# Patient Record
Sex: Male | Born: 1975 | Race: White | Hispanic: No | Marital: Married | Smoking: Never smoker
Health system: Southern US, Community
[De-identification: ages and names within clinical notes are randomized; demographics above are authoritative.]

## PROBLEM LIST (undated history)

## (undated) DIAGNOSIS — T7840XA Allergy, unspecified, initial encounter: Secondary | ICD-10-CM

## (undated) DIAGNOSIS — R1031 Right lower quadrant pain: Secondary | ICD-10-CM

## (undated) DIAGNOSIS — J45909 Unspecified asthma, uncomplicated: Secondary | ICD-10-CM

## (undated) HISTORY — PX: HERNIA REPAIR: SHX51

## (undated) HISTORY — DX: Allergy, unspecified, initial encounter: T78.40XA

## (undated) HISTORY — DX: Right lower quadrant pain: R10.31

---

## 2016-08-15 ENCOUNTER — Other Ambulatory Visit: Payer: Self-pay | Admitting: Family Medicine

## 2016-08-15 DIAGNOSIS — R1909 Other intra-abdominal and pelvic swelling, mass and lump: Secondary | ICD-10-CM

## 2017-09-24 ENCOUNTER — Emergency Department (HOSPITAL_COMMUNITY): Payer: BLUE CROSS/BLUE SHIELD

## 2017-09-24 ENCOUNTER — Observation Stay (HOSPITAL_BASED_OUTPATIENT_CLINIC_OR_DEPARTMENT_OTHER): Payer: BLUE CROSS/BLUE SHIELD

## 2017-09-24 ENCOUNTER — Observation Stay (HOSPITAL_COMMUNITY)
Admission: EM | Admit: 2017-09-24 | Discharge: 2017-09-24 | Disposition: A | Discharge status: Home / Self Care | Payer: BLUE CROSS/BLUE SHIELD | Attending: Internal Medicine | Admitting: Internal Medicine

## 2017-09-24 ENCOUNTER — Encounter (HOSPITAL_COMMUNITY): Payer: Self-pay | Admitting: Emergency Medicine

## 2017-09-24 ENCOUNTER — Other Ambulatory Visit: Payer: Self-pay

## 2017-09-24 DIAGNOSIS — R0789 Other chest pain: Secondary | ICD-10-CM | POA: Diagnosis not present

## 2017-09-24 DIAGNOSIS — J45990 Exercise induced bronchospasm: Secondary | ICD-10-CM | POA: Diagnosis present

## 2017-09-24 DIAGNOSIS — R079 Chest pain, unspecified: Secondary | ICD-10-CM | POA: Diagnosis present

## 2017-09-24 DIAGNOSIS — Z79899 Other long term (current) drug therapy: Secondary | ICD-10-CM | POA: Insufficient documentation

## 2017-09-24 DIAGNOSIS — R001 Bradycardia, unspecified: Secondary | ICD-10-CM

## 2017-09-24 HISTORY — DX: Unspecified asthma, uncomplicated: J45.909

## 2017-09-24 LAB — CBC WITH DIFFERENTIAL/PLATELET
ABS IMMATURE GRANULOCYTES: 0 10*3/uL (ref 0.0–0.1)
BASOS PCT: 0 %
Basophils Absolute: 0 10*3/uL (ref 0.0–0.1)
EOS ABS: 0.1 10*3/uL (ref 0.0–0.7)
Eosinophils Relative: 1 %
HCT: 40.5 % (ref 39.0–52.0)
Hemoglobin: 13.3 g/dL (ref 13.0–17.0)
Immature Granulocytes: 0 %
Lymphocytes Relative: 29 %
Lymphs Abs: 1.6 10*3/uL (ref 0.7–4.0)
MCH: 29.7 pg (ref 26.0–34.0)
MCHC: 32.8 g/dL (ref 30.0–36.0)
MCV: 90.4 fL (ref 78.0–100.0)
Monocytes Absolute: 0.3 10*3/uL (ref 0.1–1.0)
Monocytes Relative: 6 %
NEUTROS ABS: 3.6 10*3/uL (ref 1.7–7.7)
Neutrophils Relative %: 64 %
PLATELETS: 162 10*3/uL (ref 150–400)
RBC: 4.48 MIL/uL (ref 4.22–5.81)
RDW: 12.1 % (ref 11.5–15.5)
WBC: 5.6 10*3/uL (ref 4.0–10.5)

## 2017-09-24 LAB — TROPONIN I

## 2017-09-24 LAB — I-STAT TROPONIN, ED: Troponin i, poc: 0 ng/mL (ref 0.00–0.08)

## 2017-09-24 LAB — COMPREHENSIVE METABOLIC PANEL
ALBUMIN: 4 g/dL (ref 3.5–5.0)
ALK PHOS: 51 U/L (ref 38–126)
ALT: 29 U/L (ref 17–63)
AST: 26 U/L (ref 15–41)
Anion gap: 8 (ref 5–15)
BUN: 12 mg/dL (ref 6–20)
CALCIUM: 8.7 mg/dL — AB (ref 8.9–10.3)
CO2: 25 mmol/L (ref 22–32)
CREATININE: 1.09 mg/dL (ref 0.61–1.24)
Chloride: 108 mmol/L (ref 101–111)
GFR calc Af Amer: >60 mL/min (ref >60)
GFR calc non Af Amer: >60 mL/min (ref >60)
Glucose, Bld: 131 mg/dL — ABNORMAL HIGH (ref 65–99)
Potassium: 3.8 mmol/L (ref 3.5–5.1)
SODIUM: 141 mmol/L (ref 135–145)
Total Bilirubin: 0.8 mg/dL (ref 0.3–1.2)
Total Protein: 6.6 g/dL (ref 6.5–8.1)

## 2017-09-24 LAB — HEPATIC FUNCTION PANEL
ALBUMIN: 4 g/dL (ref 3.5–5.0)
ALT: 28 U/L (ref 17–63)
AST: 22 U/L (ref 15–41)
Alkaline Phosphatase: 54 U/L (ref 38–126)
BILIRUBIN DIRECT: 0.1 mg/dL (ref 0.1–0.5)
BILIRUBIN TOTAL: 0.7 mg/dL (ref 0.3–1.2)
Indirect Bilirubin: 0.6 mg/dL (ref 0.3–0.9)
Total Protein: 6.8 g/dL (ref 6.5–8.1)

## 2017-09-24 LAB — LIPASE, BLOOD: Lipase: 30 U/L (ref 11–51)

## 2017-09-24 LAB — D-DIMER, QUANTITATIVE (NOT AT ARMC): D DIMER QUANT: 0.35 ug{FEU}/mL (ref 0.00–0.50)

## 2017-09-24 LAB — ECHOCARDIOGRAM COMPLETE
HEIGHTINCHES: 73 in
Weight: 3520 oz

## 2017-09-24 LAB — TSH: TSH: 2.682 u[IU]/mL (ref 0.350–4.500)

## 2017-09-24 LAB — MAGNESIUM: Magnesium: 2 mg/dL (ref 1.7–2.4)

## 2017-09-24 LAB — HIV ANTIBODY (ROUTINE TESTING W REFLEX): HIV Screen 4th Generation wRfx: NONREACTIVE

## 2017-09-24 MED ORDER — ALBUTEROL SULFATE (2.5 MG/3ML) 0.083% IN NEBU: 2.5000 mg | INHALATION_SOLUTION | Freq: Four times a day (QID) | RESPIRATORY_TRACT | Status: DC | PRN

## 2017-09-24 MED ORDER — ASPIRIN EC 81 MG PO TBEC
81.0000 mg | DELAYED_RELEASE_TABLET | Freq: Every day | ORAL | Status: DC
  Administered 2017-09-24: 81 mg via ORAL
  Filled 2017-09-24: qty 1

## 2017-09-24 MED ORDER — SODIUM CHLORIDE 0.9 % IV BOLUS
1000.0000 mL | Freq: Once | INTRAVENOUS | Status: AC
Start: 2017-09-24 — End: 2017-09-24
  Administered 2017-09-24: 1000 mL via INTRAVENOUS

## 2017-09-24 MED ORDER — ONDANSETRON HCL 4 MG PO TABS: 4.0000 mg | ORAL_TABLET | Freq: Four times a day (QID) | ORAL | Status: DC | PRN

## 2017-09-24 MED ORDER — OMEPRAZOLE MAGNESIUM 20 MG PO TBEC: 20.0000 mg | DELAYED_RELEASE_TABLET | Freq: Every day | ORAL | Status: DC

## 2017-09-24 MED ORDER — ACETAMINOPHEN 325 MG PO TABS: 650.0000 mg | ORAL_TABLET | Freq: Four times a day (QID) | ORAL | Status: DC | PRN

## 2017-09-24 MED ORDER — KETOROLAC TROMETHAMINE 10 MG PO TABS
10.0000 mg | ORAL_TABLET | Freq: Once | ORAL | Status: AC
  Administered 2017-09-24: 10 mg via ORAL
  Filled 2017-09-24: qty 1

## 2017-09-24 MED ORDER — ACETAMINOPHEN 650 MG RE SUPP: 650.0000 mg | Freq: Four times a day (QID) | RECTAL | Status: DC | PRN

## 2017-09-24 MED ORDER — ONDANSETRON HCL 4 MG/2ML IJ SOLN: 4.0000 mg | Freq: Four times a day (QID) | INTRAMUSCULAR | Status: DC | PRN

## 2017-09-24 MED ORDER — MORPHINE SULFATE (PF) 4 MG/ML IV SOLN
4.0000 mg | Freq: Once | INTRAVENOUS | Status: AC
  Administered 2017-09-24: 4 mg via INTRAVENOUS
  Filled 2017-09-24: qty 1

## 2017-09-24 MED ORDER — IBUPROFEN 200 MG PO TABS: 800.0000 mg | ORAL_TABLET | Freq: Three times a day (TID) | ORAL | Status: DC

## 2017-09-24 NOTE — ED Notes (Signed)
PAGED ADMITTING TO RN  

## 2017-09-24 NOTE — ED Notes (Signed)
Pt has a question concerning his pulse rate dropping low last night. MD paged.

## 2017-09-24 NOTE — ED Notes (Signed)
Pt complaining of increased pain. MD paged. Pt updated. Pt rates pain 4/10.

## 2017-09-24 NOTE — ED Notes (Signed)
Pt speaking to hospitalist on the phone in regards to discharge.

## 2017-09-24 NOTE — ED Notes (Signed)
BIB EMS from home, pt woke up to central CP with radiation to L arm/L shoulder. Pressure like 8/10, currently 3/10. Pt also reports dizziness and lightheadedness that caused him to have to sit down. En route pt was hypotensive and bradycardic (80's systolic and HR 30's)

## 2017-09-24 NOTE — H&P (Signed)
History and Physical    Benjamin PerchesJason Mckinney ZOX:[id number] DOB: 1975/05/21 DOA: 09/24/2017  PCP: Shirlean MylarWebb, Carol, MD  Patient coming from: Home.  Chief Complaint: Chest pain.  HPI: Benjamin PerchesJason Mckinney is a 42 y.o. male with history of exercise-induced asthma presents to the ER after patient had been having chest pain which woke him up from sleep at around 1 AM this morning.  Patient chest pain is retrosternal pressure-like increase on deep inspiration radiating to his left shoulder.  Patient took some Aleve since his shoulder was hurting.  Despite which patient was still having pain and called EMS.  Patient also has been having some mild dizziness.  Denies any abdominal pain nausea vomiting shortness of breath productive cough fever or chills.  ED Course: In the ER patient was found to be having episodes of bradycardia.  Chest pain improved with morphine but has not resolved.  Chest x-ray unremarkable EKG shows sinus bradycardia with heart rate around 53 bpm early repolarization changes.  Patient admitted for further management.  Review of Systems: As per HPI, rest all negative.   Past Medical History:  Diagnosis Date  . Asthma     Past Surgical History:  Procedure Laterality Date  . HERNIA REPAIR       reports that he has never smoked. He has never used smokeless tobacco. He reports that he drinks alcohol. He reports that he does not use drugs.  No Known Allergies  Family History  Problem Relation Age of Onset  . Hypertension Mother     Prior to Admission medications   Medication Sig Start Date End Date Taking? Authorizing Provider  albuterol (PROVENTIL HFA;VENTOLIN HFA) 108 (90 Base) MCG/ACT inhaler Inhale 1-2 puffs into the lungs every 6 (six) hours as needed for wheezing or shortness of breath.   Yes [provider]    Physical Exam: Vitals:   09/24/17 0400 09/24/17 0430 09/24/17 0500 09/24/17 0530  BP: 119/84 121/84 119/86 125/84  Pulse: (!) 56 69 (!) 54 66  Resp: 15 20  19 17   Temp:      TempSrc:      SpO2: 100% 100% 97% 98%  Weight:      Height:          Constitutional: Moderately built and nourished. Vitals:   09/24/17 0400 09/24/17 0430 09/24/17 0500 09/24/17 0530  BP: 119/84 121/84 119/86 125/84  Pulse: (!) 56 69 (!) 54 66  Resp: 15 20 19 17   Temp:      TempSrc:      SpO2: 100% 100% 97% 98%  Weight:      Height:       Eyes: Anicteric no pallor. ENMT: No discharge from the ears eyes nose or mouth. Neck: No mass felt.  No neck rigidity.  No JVD appreciated. Respiratory: No rhonchi or crepitations. Cardiovascular: S1-S2 heard no murmurs appreciated. Abdomen: Soft nontender bowel sounds present.  No guarding or rigidity. Musculoskeletal: No edema no joint effusion. Skin: No rash. Neurologic: Alert awake oriented to time place and person.  Moves all extremities. Psychiatric: Appears normal.  Normal affect.   Labs on Admission: I have personally reviewed following labs and imaging studies  CBC: Recent Labs  Lab 09/24/17 0322  WBC 5.6  NEUTROABS 3.6  HGB 13.3  HCT 40.5  MCV 90.4  PLT 162   Basic Metabolic Panel: Recent Labs  Lab 09/24/17 0322  NA 141  K 3.8  CL 108  CO2 25  GLUCOSE 131*  BUN 12  CREATININE  1.09  CALCIUM 8.7*   GFR: Estimated Creatinine Clearance: 110.9 mL/min (by C-G formula based on SCr of 1.09 mg/dL). Liver Function Tests: Recent Labs  Lab 09/24/17 0322  AST 26  ALT 29  ALKPHOS 51  BILITOT 0.8  PROT 6.6  ALBUMIN 4.0   No results for input(s): LIPASE, AMYLASE in the last 168 hours. No results for input(s): AMMONIA in the last 168 hours. Coagulation Profile: No results for input(s): INR, PROTIME in the last 168 hours. Cardiac Enzymes: No results for input(s): CKTOTAL, CKMB, CKMBINDEX, TROPONINI in the last 168 hours. BNP (last 3 results) No results for input(s): PROBNP in the last 8760 hours. HbA1C: No results for input(s): HGBA1C in the last 72 hours. CBG: No results for input(s):  GLUCAP in the last 168 hours. Lipid Profile: No results for input(s): CHOL, HDL, LDLCALC, TRIG, CHOLHDL, LDLDIRECT in the last 72 hours. Thyroid Function Tests: No results for input(s): TSH, T4TOTAL, FREET4, T3FREE, THYROIDAB in the last 72 hours. Anemia Panel: No results for input(s): VITAMINB12, FOLATE, FERRITIN, TIBC, IRON, RETICCTPCT in the last 72 hours. Urine analysis: No results found for: COLORURINE, APPEARANCEUR, LABSPEC, PHURINE, GLUCOSEU, HGBUR, BILIRUBINUR, KETONESUR, PROTEINUR, UROBILINOGEN, NITRITE, LEUKOCYTESUR Sepsis Labs: @LABRCNTIP (procalcitonin:4,lacticidven:4) )No results found for this or any previous visit (from the past 240 hour(s)).   Radiological Exams on Admission: Dg Chest 2 View  Result Date: 09/24/2017 CLINICAL DATA:  Chest pain. EXAM: CHEST - 2 VIEW COMPARISON:  None. FINDINGS: The cardiomediastinal contours are normal. Mild bronchitic change. Pulmonary vasculature is normal. No consolidation, pleural effusion, or pneumothorax. No acute osseous abnormalities are seen. IMPRESSION: Mild bronchial thickening of uncertain chronicity. Electronically Signed   By: Rubye Oaks M.D.   On: 09/24/2017 03:53    EKG: Independently reviewed.  Sinus bradycardia with rate around 53 bpm early repolarization changes.  Assessment/Plan Principal Problem:   Chest pain Active Problems:   Exercise-induced asthma    1. Chest pain -as pressure-like and also pleuritic component.  Patient states his chest pain worsens on lying down which is concerning for pericarditis but EKG does not show any definite signs of it.  No obvious pericardial rub.  We will cycle cardiac markers check d-dimer 2D echo.  Aspirin for now. 2. Periods of bradycardia -Per the ER physician patient's heart rate had decreased up to the 40s.  We will closely monitor.  Check TSH. 3. History of exercise-induced asthma takes as needed albuterol prior to exercise. 4. Patient states he drinks alcohol usually 2  beers every day.  Closely monitor for any withdrawal.   DVT prophylaxis: SCDs.  Avoiding Lovenox until he gets to make sure there is no pericarditis. Code Status: Full code. Family Communication: Wife. Disposition Plan: Home. Consults called: None. Admission status: Observation.   Eduard Clos MD [facility] Pager [phone].  If 7PM-7AM, please contact night-coverage [email or web address] Password [facility]  09/24/2017, 6:14 AM

## 2017-09-24 NOTE — Progress Notes (Signed)
  Echocardiogram 2D Echocardiogram has been performed.  Pieter PartridgeBrooke S Franshesca Chipman 09/24/2017, 9:09 AM

## 2017-09-24 NOTE — ED Provider Notes (Signed)
[facility] EMERGENCY DEPARTMENT Provider Note   CSN: [id number] Arrival date & time: 09/24/17  2130     History   Chief Complaint Chief Complaint  Patient presents with  . Chest Pain    HPI Benjamin Mckinney is a 42 y.o. male.  Patient is a 42 year old male with no significant past medical history.  He presents today with complaints of chest discomfort.  This woke him from sleep at approximately 130 this morning after going to bed feeling well and in his normal state of health.  He describes it as a pressure to the left side of his chest radiating to his shoulder blade.  This is not associated with any shortness of breath, nausea, diaphoresis, or radiation to the arm or jaw.  He does report some dizziness.  911 was called and the patient was transported here.  While in transport, his heart rate decreased into the 30s on several occasions.  Transport was otherwise uneventful.  Patient has no prior cardiac history and no cardiac risk factors.  The history is provided by the patient.  Chest Pain   This is a new problem. Episode onset: 1:30 AM. The problem occurs constantly. The problem has been gradually worsening. The pain is present in the substernal region. The pain is moderate. The quality of the pain is described as pressure-like. The pain radiates to the left shoulder. Associated symptoms include dizziness. Pertinent negatives include no cough, no diaphoresis, no fever, no nausea and no shortness of breath.    Past Medical History:  Diagnosis Date  . Asthma     There are no active problems to display for this patient.   History reviewed. No pertinent surgical history.      Home Medications    Prior to Admission medications   Not on File    Family History No family history on file.  Social History Social History   Tobacco Use  . Smoking status: Never Smoker  . Smokeless tobacco: Never Used  Substance Use Topics  . Alcohol use: Yes    Frequency:  Never    Comment: Socially 2-3 drinks per day  . Drug use: Never     Allergies   Patient has no known allergies.   Review of Systems Review of Systems  Constitutional: Negative for diaphoresis and fever.  Respiratory: Negative for cough and shortness of breath.   Cardiovascular: Positive for chest pain.  Gastrointestinal: Negative for nausea.  Neurological: Positive for dizziness.  All other systems reviewed and are negative.    Physical Exam Updated Vital Signs BP 120/89 (BP Location: Right Arm)   Pulse 65   Temp (!) 97.4 F (36.3 C) (Oral)   Resp 16   Ht 6\' 1"  (1.854 m)   Wt 99.8 kg (220 lb)   SpO2 100%   BMI 29.03 kg/m   Physical Exam  Constitutional: He is oriented to person, place, and time. He appears well-developed and well-nourished. No distress.  HENT:  Head: Normocephalic and atraumatic.  Mouth/Throat: Oropharynx is clear and moist.  Neck: Normal range of motion. Neck supple.  Cardiovascular: Regular rhythm. Bradycardia present. Exam reveals no friction rub.  No murmur heard. Pulmonary/Chest: Effort normal and breath sounds normal. No respiratory distress. He has no wheezes. He has no rales.  Abdominal: Soft. Bowel sounds are normal. He exhibits no distension. There is no tenderness.  Musculoskeletal: Normal range of motion. He exhibits no edema.       Right lower leg: Normal. He exhibits  no tenderness and no edema.       Left lower leg: Normal. He exhibits no tenderness and no edema.  Neurological: He is alert and oriented to person, place, and time. Coordination normal.  Skin: Skin is warm and dry. He is not diaphoretic.  Nursing note and vitals reviewed.    ED Treatments / Results  Labs (all labs ordered are listed, but only abnormal results are displayed) Labs Reviewed  COMPREHENSIVE METABOLIC PANEL  CBC WITH DIFFERENTIAL/PLATELET  I-STAT TROPONIN, ED    EKG EKG Interpretation  Date/Time:  Thursday September 24 2017 03:16:01 EDT Ventricular  Rate:  53 PR Interval:    QRS Duration: 109 QT Interval:  443 QTC Calculation: 416 R Axis:   72 Text Interpretation:  Sinus Bradycardia Early Repolarization Confirmed by Geoffery LyonseLo, Mabelle Mungin ([id number]) on 09/24/2017 3:21:13 AM   Radiology No results found.  Procedures Procedures (including critical care time)  Medications Ordered in ED Medications  sodium chloride 0.9 % bolus 1,000 mL (has no administration in time range)     Initial Impression / Assessment and Plan / ED Course  I have reviewed the triage vital signs and the nursing notes.  Pertinent labs & imaging results that were available during my care of the patient were reviewed by me and considered in my medical decision making (see chart for details).  Patient presents here with complaints of chest discomfort that woke him from sleep at approximately 1:30 AM.  His work-up is thus far unremarkable including a negative troponin and normal EKG.  He has had episodes of bradycardia while in the ambulance and in the emergency department.  He has been seen with his rate dropping into the 30s on several occasions.  I feel as though the patient should be admitted for further work-up.  I have spoken with Dr. Toniann FailKakrakandy who agrees to admit.  Final Clinical Impressions(s) / ED Diagnoses   Final diagnoses:  None    ED Discharge Orders    None       Geoffery Lyonselo, Debroh Sieloff, MD 09/24/17 [phone]

## 2020-02-09 IMAGING — DX DG CHEST 2V
2 series · 2 of 2 positions shown · non-contrast
Comparison: None.

CLINICAL DATA: Chest pain.

EXAM:
CHEST - 2 VIEW

[w chest pa]
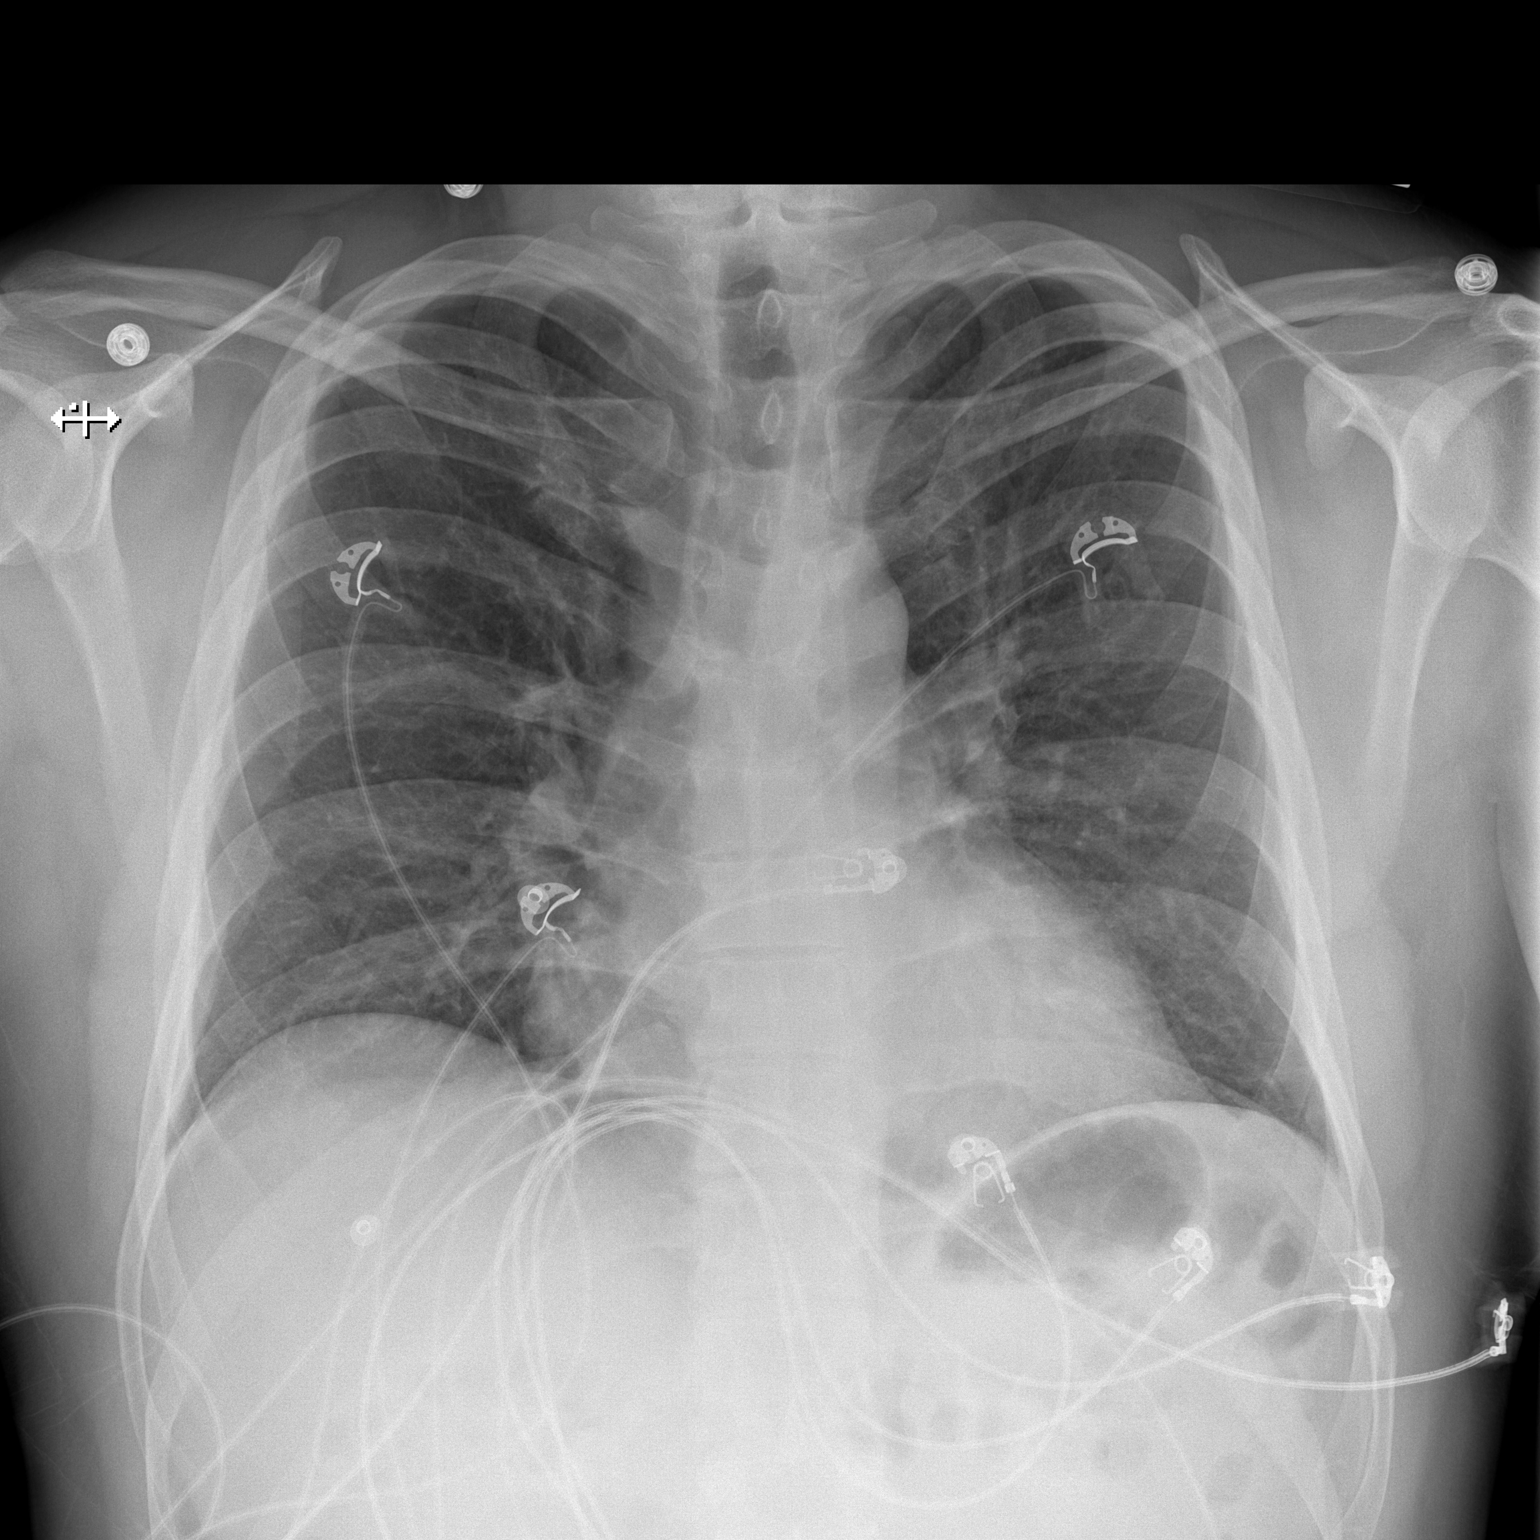

[w chest lat]
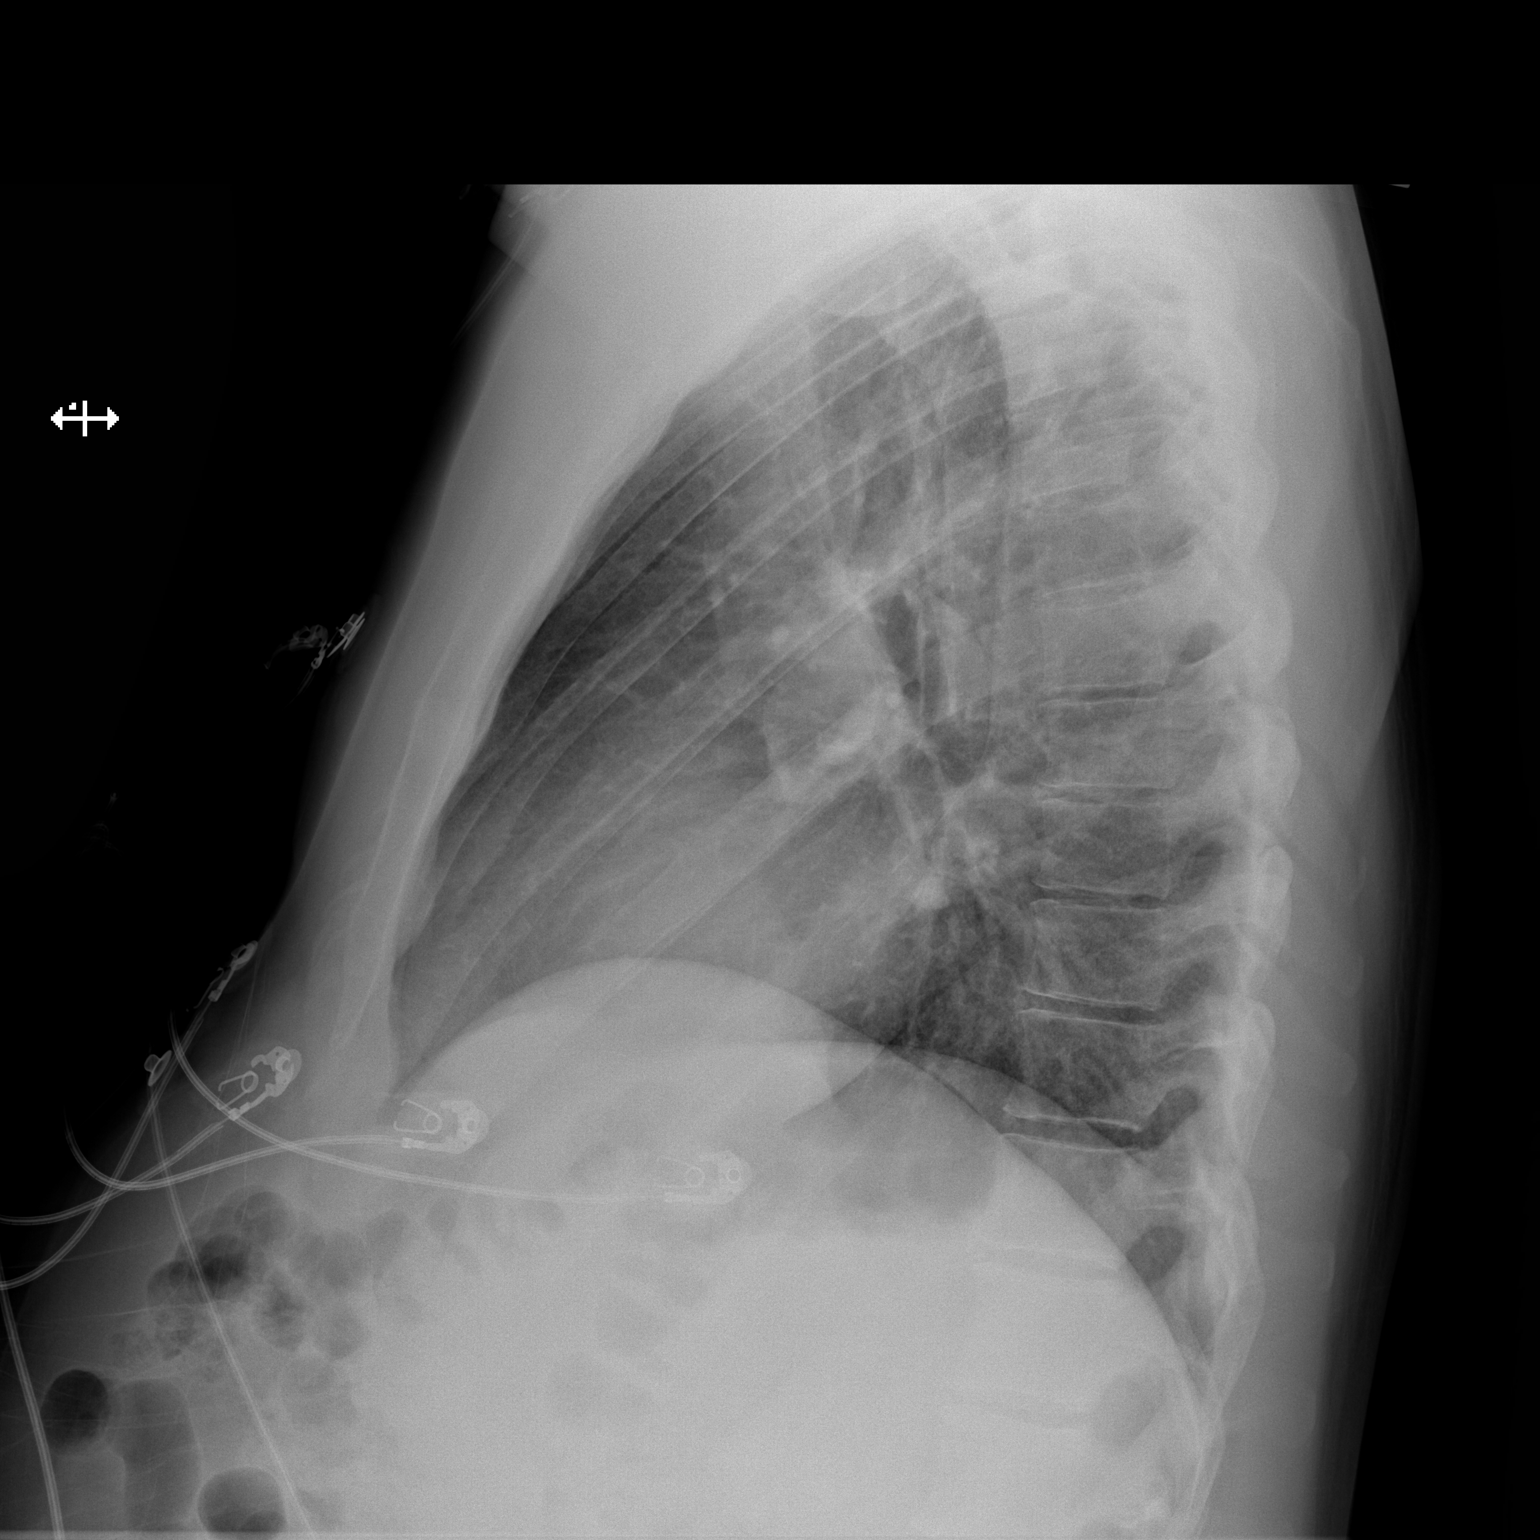

[2 of 2 positions shown; findings below may reference images not displayed]

FINDINGS: The cardiomediastinal contours are normal. Mild bronchitic change.
Pulmonary vasculature is normal. No consolidation, pleural effusion,
or pneumothorax. No acute osseous abnormalities are seen.
IMPRESSION: Mild bronchial thickening of uncertain chronicity.

## 2021-08-08 ENCOUNTER — Encounter (HOSPITAL_BASED_OUTPATIENT_CLINIC_OR_DEPARTMENT_OTHER): Payer: Self-pay | Admitting: Nurse Practitioner

## 2021-08-08 ENCOUNTER — Ambulatory Visit (HOSPITAL_BASED_OUTPATIENT_CLINIC_OR_DEPARTMENT_OTHER): Payer: No Typology Code available for payment source | Admitting: Nurse Practitioner

## 2021-08-08 VITALS — BP 117/82 | HR 80 | Temp 98.6°F | Ht 73.0 in | Wt 217.4 lb

## 2021-08-08 DIAGNOSIS — J45990 Exercise induced bronchospasm: Secondary | ICD-10-CM | POA: Diagnosis not present

## 2021-08-08 DIAGNOSIS — R55 Syncope and collapse: Secondary | ICD-10-CM

## 2021-08-08 DIAGNOSIS — M940 Chondrocostal junction syndrome [Tietze]: Secondary | ICD-10-CM

## 2021-08-08 DIAGNOSIS — R0782 Intercostal pain: Secondary | ICD-10-CM

## 2021-08-08 DIAGNOSIS — Z0000 Encounter for general adult medical examination without abnormal findings: Secondary | ICD-10-CM

## 2021-08-08 DIAGNOSIS — R944 Abnormal results of kidney function studies: Secondary | ICD-10-CM

## 2021-08-08 MED ORDER — MELOXICAM 15 MG PO TABS: 15.0000 mg | ORAL_TABLET | Freq: Every day | ORAL | 0 refills | Status: DC

## 2021-08-08 MED ORDER — ALBUTEROL SULFATE HFA 108 (90 BASE) MCG/ACT IN AERS: 1.0000 | INHALATION_SPRAY | Freq: Four times a day (QID) | RESPIRATORY_TRACT | 6 refills | Status: DC | PRN

## 2021-08-08 NOTE — Patient Instructions (Signed)
Thank you for choosing [facility] [facility] for your Primary Care needs. I am excited for the opportunity to partner with you to meet your health care goals. It was a pleasure meeting you today! ? ?Recommendations from today's visit: ?I have sent the referral for cardiology for the office on the second floor of our building. Someone from that office will call you to schedule this in the next few days ?If you have any of the symptoms again, please let us know immediately.  ?I would avoid energy drinks for now . ?We will get some labs today to make sure that there is nothing going on that we need to address that could have caused this.  ?You can look up vasovagal reactions at sites like [facility] or [facility] to see if this correlates with anything you have felt ? ?Information on diet, exercise, and health maintenance recommendations are listed below. This is information to help you be sure you are on track for optimal health and monitoring.  ? ?Please look over this and let us know if you have any questions or if you have completed any of the health maintenance outside of [place] so that we can be sure your records are up to date.  ?___________________________________________________________ ?About Me: ?I am an Adult-Geriatric Nurse Practitioner with a background in caring for patients for more than 20 years with a strong intensive care background. I provide primary care and sports medicine services to patients age 35 and older within this office. My education had a strong focus on caring for the older adult population, which I am passionate about. I am also the director of the APP Fellowship with [facility].  ? ?My desire is to provide you with the best service through preventive medicine and supportive care. I consider you a part of the medical team and value your input. I work diligently to ensure that you are heard and your needs are met in a safe and effective manner. I want  you to feel comfortable with me as your provider and want you to know that your health concerns are important to me. ? ?For your information, our office hours are: ?Monday, Tuesday, and Thursday 8:00 AM - 5:00 PM ?Wednesday and Friday 8:00 AM - 12:00 PM.  ? ?In my time away from the office I am teaching new APP's within the system and am unavailable, but my partner, Dr. Burnard Bunting is in the office for emergent needs.  ? ?If you have questions or concerns, please call our office at [phone] or send [place] a MyChart message and we will respond as quickly as possible.  ?____________________________________________________________ ?MyChart:  ?For all urgent or time sensitive needs we ask that you please call the office to avoid delays. Our number is [phone]. ?MyChart is not constantly monitored and due to the large volume of messages a day, replies may take up to 72 business hours. ? ?MyChart Policy: ?MyChart allows for you to see your visit notes, after visit summary, provider recommendations, lab and tests results, make an appointment, request refills, and contact your provider or the office for non-urgent questions or concerns. Providers are seeing patients during normal business hours and do not have built in time to review MyChart messages.  ?We ask that you allow a minimum of 3 business days for responses to [employer]. For this reason, please do not send urgent requests through [place]. Please call the office at [phone]. ?New and ongoing conditions may require  a visit. We have virtual and in person visit available for your convenience.  ?Complex MyChart concerns may require a visit. Your provider may request you schedule a virtual or in person visit to ensure we are providing the best care possible. ?MyChart messages sent after 11:00 AM on Friday will not be received by the provider until Monday morning.  ?  ?Lab and Test Results: ?You will receive your lab and test results on MyChart as soon as  they are completed and results have been sent by the lab or testing facility. Due to this service, you will receive your results BEFORE your provider.  ?I review lab and tests results each morning prior to seeing patients. Some results require collaboration with other providers to ensure you are receiving the most appropriate care. For this reason, we ask that you please allow a minimum of 3-5 business days from the time the ALL results have been received for your provider to receive and review lab and test results and contact you about these.  ?Most lab and test result comments from the provider will be sent through [place]. Your provider may recommend changes to the plan of care, follow-up visits, repeat testing, ask questions, or request an office visit to discuss these results. You may reply directly to this message or call the office at [phone] to provide information for the provider or set up an appointment. ?In some instances, you will be called with test results and recommendations. Please let us know if this is preferred and we will make note of this in your chart to provide this for you.    ?If you have not heard a response to your lab or test results in 5 business days from all results returning to [place], please call the office to let us know. We ask that you please avoid calling prior to this time unless there is an emergent concern. Due to high call volumes, this can delay the resulting process. ? ?After Hours: ?For all non-emergency after hours needs, please call the office at [phone] and select the option to reach the on-call provider service. On-call services are shared between multiple [place] offices and therefore it will not be possible to speak directly with your provider. On-call providers may provide medical advice and recommendations, but are unable to provide refills for maintenance medications.  ?For all emergency or urgent medical needs after normal business hours, we  recommend that you seek care at the closest Urgent Care or Emergency Department to ensure appropriate treatment in a timely manner.  ?[facility] at [place] has a 24 hour emergency room located on the ground floor for your convenience.  ? ?Urgent Concerns During the Business Day ?Providers are seeing patients from 8AM to [place] with a busy schedule and are most often not able to respond to non-urgent calls until the end of the day or the next business day. ?If you should have URGENT concerns during the day, please call and speak to the nurse or schedule a same day appointment so that we can address your concern without delay.  ? ?Thank you, again, for choosing me as your health care partner. I appreciate your trust and look forward to learning more about you.  ? ?Worthy Keeler, DNP, AGNP-c ?___________________________________________________________ ? ?Health Maintenance Recommendations ?Screening Testing ?Mammogram ?Every 1 -2 years based on history and risk factors ?Starting at age 56 ?Pap Smear ?Ages 21-39 every 3 years ?Ages 7-65 every 5 years with HPV testing ?More frequent testing may  be required based on results and history ?Colon Cancer Screening ?Every 1-10 years based on test performed, risk factors, and history ?Starting at age 22 ?Bone Density Screening ?Every 2-10 years based on history ?Starting at age 69 for women ?Recommendations for men differ based on medication usage, history, and risk factors ?AAA Screening ?One time ultrasound ?Men 79-45 years old who have every smoked ?Lung Cancer Screening ?Low Dose Lung CT every 12 months ?Age 38-80 years with a 30 pack-year smoking history who still smoke or who have quit within the last 15 years ? ?Screening Labs ?Routine  Labs: Complete Blood Count (CBC), Complete Metabolic Panel (CMP), Cholesterol (Lipid Panel) ?Every 6-12 months based on history and medications ?May be recommended more frequently based on current conditions or previous  results ?Hemoglobin A1c Lab ?Every 3-12 months based on history and previous results ?Starting at age 41 or earlier with diagnosis of diabetes, high cholesterol, BMI >26, and/or risk factors ?Frequent monitoring for

## 2021-08-08 NOTE — Progress Notes (Signed)
?Orma Render, DNP, AGNP-c ?Primary Care & Sports Medicine ?[place], [place] [address] ?[phone] ? ?New patient visit ? ? ?Patient: Benjamin Mckinney   DOB: 1976-02-18   46 y.o. Male  MRN: [id number] ?Visit Date: 08/08/2021 ? ?Patient Care Team: ?Riley Hallum, Coralee Pesa, NP as PCP - General (Nurse Practitioner) ? ?Today's Vitals  ? 08/08/21 0847  ?BP: 117/82  ?Pulse: 80  ?Temp: 98.6 ?F (37 ?C)  ?SpO2: 97%  ?Weight: 217 lb 6.4 oz (98.6 kg)  ?Height: $RemoveB'6\' 1"'CNXCnTCH$  (1.854 m)  ? ?Body mass index is 28.68 kg/m?.  ? ?Today's healthcare provider: Orma Render, NP  ? ?Chief Complaint  ?Patient presents with  ? Establish Care  ? ?Subjective  ?  ?Kenderick Kobler is a 47 y.o. male who presents today as a new patient to establish care.  ?  ?Patient endorses the following concerns presently: ?Syncope and collapse  ?- appx 1 month ago while on a plane waiting for takeoff experienced syncopal episode ?- unsure of how long he was unconscious, but possibly 5 minutes or less  ?- when he awoke he felt shaky, but otherwise OK ?- paramedics came and checked him out and vitals were all normal within 10 minutes of event occurence ?- had an energy drink prior to the event ?- vomiting while unconscious ?- prior to event noted dizziness and nausea, but no CP, palpitations, headache, vision changes, weakness ?- had breakfast and coffee prior to the event ?- has never happened before or since ?- he does admit to drinking alcohol the night before, but feels he was well hydrated during the event ?- In 2019 he experienced CP, in ED was noted to be hypotensive and bradycardic into the 30's ?- no follow-up since that time ?- no personal or FHx cardiac dz ?- occasional sternal CP after working out- feels like "connect tissue" ?- previous ECHO when younger, not sure what for ? ?Stays active ?Works out in the morning at [employer] with his 22 year old daughter ?Backpacking and hiking routinely ? ?History reviewed and reveals  the following: ?Past Medical History:  ?Diagnosis Date  ? Asthma   ? exercise induced  ? Groin pain, right   ? ?Past Surgical History:  ?Procedure Laterality Date  ? HERNIA REPAIR    ? 46 years old- umbilical  ? ?Family Status  ?Relation Name Status  ? Mother  (Not Specified)  ? Father  (Not Specified)  ? ?Family History  ?Problem Relation Age of Onset  ? Hypertension Mother   ? Liver disease Father   ? ?Social History  ? ?Socioeconomic History  ? Marital status: Married  ?  Spouse name: Not on file  ? Number of children: Not on file  ? Years of education: Not on file  ? Highest education level: Not on file  ?Occupational History  ? Not on file  ?Tobacco Use  ? Smoking status: Never  ?  Passive exposure: Never  ? Smokeless tobacco: Never  ?Substance and Sexual Activity  ? Alcohol use: Yes  ?  Comment: Socially 2-3 drinks per day  ? Drug use: Never  ? Sexual activity: Yes  ?Other Topics Concern  ? Not on file  ?Social History Narrative  ? Not on file  ? ?Social Determinants of Health  ? ?Financial Resource Strain: Not on file  ?Food Insecurity: Not on file  ?Transportation Needs: Not on file  ?Physical Activity: Not on file  ?Stress: Not on  file  ?Social Connections: Not on file  ? ?Outpatient Medications Prior to Visit  ?Medication Sig  ? [DISCONTINUED] albuterol (PROVENTIL HFA;VENTOLIN HFA) 108 (90 Base) MCG/ACT inhaler Inhale 1-2 puffs into the lungs every 6 (six) hours as needed for wheezing or shortness of breath.  ? [DISCONTINUED] ibuprofen (ADVIL,MOTRIN) 200 MG tablet Take 4 tablets (800 mg total) by mouth every 8 (eight) hours. Take every 8 hours SCHEDULED for 3 days.  Then stop.  ? [DISCONTINUED] omeprazole (PRILOSEC OTC) 20 MG tablet Take 1 tablet (20 mg total) by mouth daily. Take every day while on Ibuprofen (Patient not taking: Reported on 08/08/2021)  ? ?No facility-administered medications prior to visit.  ? ?No Known Allergies ? ?There is no immunization history on file for this patient. ? ?Review of  Systems ?All review of systems negative except what is listed in the HPI ? ? Objective  ?  ?BP 117/82   Pulse 80   Temp 98.6 ?F (37 ?C)   Ht $R'6\' 1"'XX$  (1.854 m)   Wt 217 lb 6.4 oz (98.6 kg)   SpO2 97%   BMI 28.68 kg/m?  ?Physical Exam ?Vitals and nursing note reviewed.  ?Constitutional:   ?   Appearance: Normal appearance.  ?HENT:  ?   Head: Normocephalic.  ?Eyes:  ?   Extraocular Movements: Extraocular movements intact.  ?   Conjunctiva/sclera: Conjunctivae normal.  ?   Pupils: Pupils are equal, round, and reactive to light.  ?Neck:  ?   Vascular: No carotid bruit.  ?Cardiovascular:  ?   Rate and Rhythm: Normal rate and regular rhythm. No extrasystoles are present. ?   Chest Wall: PMI is not displaced.  ?   Pulses: Normal pulses.     ?     Radial pulses are 2+ on the right side and 2+ on the left side.  ?     Dorsalis pedis pulses are 2+ on the right side and 2+ on the left side.  ?   Heart sounds: Normal heart sounds. No murmur heard. ?Pulmonary:  ?   Effort: Pulmonary effort is normal.  ?   Breath sounds: Normal breath sounds. No wheezing.  ?Abdominal:  ?   General: Abdomen is flat. Bowel sounds are normal. There is no distension.  ?   Palpations: Abdomen is soft.  ?   Tenderness: There is no abdominal tenderness. There is no guarding.  ?Musculoskeletal:     ?   General: Normal range of motion.  ?   Cervical back: Normal range of motion.  ?   Right lower leg: No edema.  ?   Left lower leg: No edema.  ?Lymphadenopathy:  ?   Cervical: No cervical adenopathy.  ?Skin: ?   General: Skin is warm and dry.  ?   Capillary Refill: Capillary refill takes less than 2 seconds.  ?Neurological:  ?   General: No focal deficit present.  ?   Mental Status: He is alert and oriented to person, place, and time.  ?   GCS: GCS eye subscore is 4. GCS verbal subscore is 5. GCS motor subscore is 6.  ?   Cranial Nerves: Cranial nerves 2-12 are intact.  ?   Sensory: Sensation is intact.  ?   Motor: Motor function is intact. No weakness.   ?   Coordination: Coordination is intact.  ?   Gait: Gait is intact.  ?   Deep Tendon Reflexes: Reflexes are normal and symmetric.  ?Psychiatric:     ?  Mood and Affect: Mood normal.     ?   Behavior: Behavior normal.     ?   Thought Content: Thought content normal.     ?   Judgment: Judgment normal.  ? ? ?Results for orders placed or performed in visit on 08/08/21  ?CBC with Differential  ?Result Value Ref Range  ? WBC 4.0 3.4 - 10.8 x10E3/uL  ? RBC 4.99 4.14 - 5.80 x10E6/uL  ? Hemoglobin 15.5 13.0 - 17.7 g/dL  ? Hematocrit 45.3 37.5 - 51.0 %  ? MCV 91 79 - 97 fL  ? MCH 31.1 26.6 - 33.0 pg  ? MCHC 34.2 31.5 - 35.7 g/dL  ? RDW 11.7 11.6 - 15.4 %  ? Platelets 213 150 - 450 x10E3/uL  ? Neutrophils 52 Not Estab. %  ? Lymphs 36 Not Estab. %  ? Monocytes 10 Not Estab. %  ? Eos 1 Not Estab. %  ? Basos 1 Not Estab. %  ? Neutrophils Absolute 2.1 1.4 - 7.0 x10E3/uL  ? Lymphocytes Absolute 1.5 0.7 - 3.1 x10E3/uL  ? Monocytes Absolute 0.4 0.1 - 0.9 x10E3/uL  ? EOS (ABSOLUTE) 0.0 0.0 - 0.4 x10E3/uL  ? Basophils Absolute 0.0 0.0 - 0.2 x10E3/uL  ? Immature Granulocytes 0 Not Estab. %  ? Immature Grans (Abs) 0.0 0.0 - 0.1 x10E3/uL  ?Comprehensive metabolic panel  ?Result Value Ref Range  ? Glucose 99 70 - 99 mg/dL  ? BUN 16 6 - 24 mg/dL  ? Creatinine, Ser 1.07 0.76 - 1.27 mg/dL  ? eGFR 87 >59 mL/min/1.73  ? BUN/Creatinine Ratio 15 9 - 20  ? Sodium 141 134 - 144 mmol/L  ? Potassium 4.9 3.5 - 5.2 mmol/L  ? Chloride 104 96 - 106 mmol/L  ? CO2 22 20 - 29 mmol/L  ? Calcium 9.9 8.7 - 10.2 mg/dL  ? Total Protein 7.4 6.0 - 8.5 g/dL  ? Albumin 4.8 4.0 - 5.0 g/dL  ? Globulin, Total 2.6 1.5 - 4.5 g/dL  ? Albumin/Globulin Ratio 1.8 1.2 - 2.2  ? Bilirubin Total 0.6 0.0 - 1.2 mg/dL  ? Alkaline Phosphatase 64 44 - 121 IU/L  ? AST 21 0 - 40 IU/L  ? ALT 30 0 - 44 IU/L  ?TSH  ?Result Value Ref Range  ? TSH 2.640 0.450 - 4.500 uIU/mL  ?Lipid Profile  ?Result Value Ref Range  ? Cholesterol, Total 203 (H) 100 - 199 mg/dL  ? Triglycerides 183 (H) 0 -  149 mg/dL  ? HDL 42 >39 mg/dL  ? VLDL Cholesterol Cal 33 5 - 40 mg/dL  ? LDL Chol Calc (NIH) 128 (H) 0 - 99 mg/dL  ? Chol/HDL Ratio 4.8 0.0 - 5.0 ratio  ?Hemoglobin A1c  ?Result Value Ref Range  ? Hgb A1c MFr

## 2021-08-09 LAB — CBC WITH DIFFERENTIAL/PLATELET
Basophils Absolute: 0 10*3/uL (ref 0.0–0.2)
Basos: 1 %
EOS (ABSOLUTE): 0 10*3/uL (ref 0.0–0.4)
Eos: 1 %
Hematocrit: 45.3 % (ref 37.5–51.0)
Hemoglobin: 15.5 g/dL (ref 13.0–17.7)
Immature Grans (Abs): 0 10*3/uL (ref 0.0–0.1)
Immature Granulocytes: 0 %
Lymphocytes Absolute: 1.5 10*3/uL (ref 0.7–3.1)
Lymphs: 36 %
MCH: 31.1 pg (ref 26.6–33.0)
MCHC: 34.2 g/dL (ref 31.5–35.7)
MCV: 91 fL (ref 79–97)
Monocytes Absolute: 0.4 10*3/uL (ref 0.1–0.9)
Monocytes: 10 %
Neutrophils Absolute: 2.1 10*3/uL (ref 1.4–7.0)
Neutrophils: 52 %
Platelets: 213 10*3/uL (ref 150–450)
RBC: 4.99 x10E6/uL (ref 4.14–5.80)
RDW: 11.7 % (ref 11.6–15.4)
WBC: 4 10*3/uL (ref 3.4–10.8)

## 2021-08-09 LAB — COMPREHENSIVE METABOLIC PANEL
ALT: 30 IU/L (ref 0–44)
AST: 21 IU/L (ref 0–40)
Albumin/Globulin Ratio: 1.8 (ref 1.2–2.2)
Albumin: 4.8 g/dL (ref 4.0–5.0)
Alkaline Phosphatase: 64 IU/L (ref 44–121)
BUN/Creatinine Ratio: 15 (ref 9–20)
BUN: 16 mg/dL (ref 6–24)
Bilirubin Total: 0.6 mg/dL (ref 0.0–1.2)
CO2: 22 mmol/L (ref 20–29)
Calcium: 9.9 mg/dL (ref 8.7–10.2)
Chloride: 104 mmol/L (ref 96–106)
Creatinine, Ser: 1.07 mg/dL (ref 0.76–1.27)
Globulin, Total: 2.6 g/dL (ref 1.5–4.5)
Glucose: 99 mg/dL (ref 70–99)
Potassium: 4.9 mmol/L (ref 3.5–5.2)
Sodium: 141 mmol/L (ref 134–144)
Total Protein: 7.4 g/dL (ref 6.0–8.5)
eGFR: 87 mL/min/{1.73_m2} (ref >59)

## 2021-08-09 LAB — T4, FREE: Free T4: 1.48 ng/dL (ref 0.82–1.77)

## 2021-08-09 LAB — LIPID PANEL
Chol/HDL Ratio: 4.8 ratio (ref 0.0–5.0)
Cholesterol, Total: 203 mg/dL — ABNORMAL HIGH (ref 100–199)
HDL: 42 mg/dL (ref >39)
LDL Chol Calc (NIH): 128 mg/dL — ABNORMAL HIGH (ref 0–99)
Triglycerides: 183 mg/dL — ABNORMAL HIGH (ref 0–149)
VLDL Cholesterol Cal: 33 mg/dL (ref 5–40)

## 2021-08-09 LAB — HEMOGLOBIN A1C
Est. average glucose Bld gHb Est-mCnc: 100 mg/dL
Hgb A1c MFr Bld: 5.1 % (ref 4.8–5.6)

## 2021-08-09 LAB — TSH: TSH: 2.64 u[IU]/mL (ref 0.450–4.500)

## 2021-08-14 ENCOUNTER — Encounter (HOSPITAL_BASED_OUTPATIENT_CLINIC_OR_DEPARTMENT_OTHER): Payer: Self-pay | Admitting: Nurse Practitioner

## 2021-08-14 ENCOUNTER — Other Ambulatory Visit (HOSPITAL_BASED_OUTPATIENT_CLINIC_OR_DEPARTMENT_OTHER): Payer: Self-pay | Admitting: Nurse Practitioner

## 2021-08-14 DIAGNOSIS — R944 Abnormal results of kidney function studies: Secondary | ICD-10-CM | POA: Insufficient documentation

## 2021-08-17 ENCOUNTER — Encounter (HOSPITAL_BASED_OUTPATIENT_CLINIC_OR_DEPARTMENT_OTHER): Payer: Self-pay | Admitting: Nurse Practitioner

## 2021-08-17 NOTE — Assessment & Plan Note (Signed)
Labs show eGFR of 87 indicating slight reduction in expected kidney function. Other renal labs normal. Recommend f/u with repeat kidney function testing and urine micro for further evaluation. Will plan to continue to monitor closely for decline in kidney function. At this time, no further intervention is required. Recommend avoid nephrotoxic medications. Previously prescribed meloxicam should be used on intermittent basis only when necessary. Will continue to follow.  ?

## 2021-08-17 NOTE — Assessment & Plan Note (Signed)
Historical. No alarm sx present today. Will refill albuterol inhaler. Monitor for new or worsening sx and report immediately.  ?

## 2021-08-17 NOTE — Assessment & Plan Note (Signed)
Single episode syncope and collapse of unknown etiology. Reported EMS evaluation after event showed normal/stable VS. No residual sx.  ?Episode does not appear to be vasovagal in nature based on description of events. Patient is frequent flyer and denies anxiety over this. Given hx of bradycardia and hypotension in ED visit in 2019, consider possible cardiac condition that could be undiagnosed at this time.  ?Today appearance is normal with vital signs WNL. Discussed with patient that given the episode, I do recommend evaluation with cardiology for complete work-up to ensure that underlying condition is not missed. He is in agreement to this today. Will obtain baseline labs today and refer to cardiology for further evaluation. Patient instructed to report to ED immediately if symptoms reoccur. He expressed understanding.  ?

## 2021-08-17 NOTE — Assessment & Plan Note (Signed)
Sternal CP intermittently after working out and using chest muscles. Appears to be costochondral in nature. No alarm sx present today. Reproducible tenderness is present along the sternal border present. Recommend monitoring closely and avoiding heavy lifting during periods of exacerbation. Given repeated occurrences, suspect intermittent chronic inflammation present. Will provide patient with meloxicam for PRN use. Advised against avoiding use of other NSAIDs while taking this medication. If sx continue, will send referral to ortho for further evaluation and recommendations.  ?

## 2021-08-27 ENCOUNTER — Ambulatory Visit (HOSPITAL_BASED_OUTPATIENT_CLINIC_OR_DEPARTMENT_OTHER): Payer: No Typology Code available for payment source | Admitting: Cardiology

## 2021-08-27 ENCOUNTER — Encounter (HOSPITAL_BASED_OUTPATIENT_CLINIC_OR_DEPARTMENT_OTHER): Payer: Self-pay | Admitting: Cardiology

## 2021-08-27 VITALS — BP 134/94 | HR 62 | Ht 73.0 in | Wt 221.7 lb

## 2021-08-27 DIAGNOSIS — Z7189 Other specified counseling: Secondary | ICD-10-CM

## 2021-08-27 DIAGNOSIS — R55 Syncope and collapse: Secondary | ICD-10-CM | POA: Diagnosis not present

## 2021-08-27 DIAGNOSIS — Z87898 Personal history of other specified conditions: Secondary | ICD-10-CM

## 2021-08-27 NOTE — Progress Notes (Signed)
?Cardiology Office Note:   ? ?Date:  08/27/2021  ? ?ID:  Benjamin Mckinney, DOB [birth year], MRN [id number] ? ?PCP:  Benjamin Eth, NP  ?Cardiologist:  Benjamin Red, MD ? ?Referring MD: Benjamin Eth, NP  ? ?CC: new patient consultation for syncope ? ?History of Present Illness:   ? ?Benjamin Mckinney is a 46 y.o. male without prior cardiac history who is seen as a new consult at the request of Benjamin Mckinney, Benjamin Amabile, NP for the evaluation and management of syncope. ? ?Note from 08/08/21 with Benjamin Skeens, NP reviewed. Noted prior episode of syncope while on a plane waiting for takeoff. Reported normal vitals by paramedics. Noted emesis while unconscious. Prodrome of dizziness/nausea, no chest pain/palpitations. Referred to cardiology for further evaluation. ? ?Syncope: ?Initial episode:Was on a flight, felt fine, talking to coworkers. Shortly after he sat down, felt nauseated, laid back. Started to feel dizzy, tried to rest. Was up late the night before, had alcohol the night before, had an energy drink that morning. Plane was taxing, turned back to gate. Evaluated by paramedics, told things were normal. Thought he might have just fallen asleep, but bystanders report loss of consciousness and vomiting. ? 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 ? ?Chest pain: chest pain in 2019, reviewed. Workup unremarkable. Initially told HR was 30s, on ECG with sinus bradycardia in the 50s. Told it was MSK and treated with pain. Continues to have intermittently.  ? ?Family history: No family history of heart disease. Both parents 20 and generally healthy. 2 biological siblings (plus one adopted) all in good health. ? ?Past Medical History:  ?Diagnosis Date  ? Asthma   ? exercise induced  ? Groin pain, right   ? ? ?Past Surgical History:  ?Procedure Laterality Date  ? HERNIA REPAIR    ? 46 years old- umbilical  ? ? ?Current  Medications: ?Current Outpatient Medications on File Prior to Visit  ?Medication Sig  ? albuterol (VENTOLIN HFA) 108 (90 Base) MCG/ACT inhaler Inhale 1-2 puffs into the lungs every 6 (six) hours as needed for wheezing or shortness of breath.  ? meloxicam (MOBIC) 15 MG tablet Take 1 tablet (15 mg total) by mouth daily.  ? ?No current facility-administered medications on file prior to visit.  ?  ? ?Allergies:   Patient has no known allergies.  ? ?Social History  ? ?Tobacco Use  ? Smoking status: Never  ?  Passive exposure: Never  ? Smokeless tobacco: Never  ?Substance Use Topics  ? Alcohol use: Yes  ?  Comment: Socially 2-3 drinks per day  ? Drug use: Never  ? ? ?Family History: ?family history includes Hyperlipidemia in his father; Hypertension in his mother; Liver disease in his father. ? ?ROS:   ?Please see the history of present illness.  Additional pertinent ROS: ?Constitutional: Negative for chills, fever, night sweats, unintentional weight loss  ?HENT: Negative for ear pain and hearing loss.   ?Eyes: Negative for loss of vision and eye pain.  ?Respiratory: Negative for cough, sputum, wheezing.   ?Cardiovascular: See HPI. ?Gastrointestinal: Negative for abdominal pain, melena, and hematochezia.  ?Genitourinary: Negative for dysuria and hematuria.  ?Musculoskeletal: Negative for falls and myalgias.  ?Skin: Negative for itching and rash.  ?Neurological: Negative for focal weakness, focal sensory changes and loss of consciousness.  ?Endo/Heme/Allergies: Does not bruise/bleed easily.   ? ? ?EKGs/Labs/Other Studies Reviewed:   ? ?  The following studies were reviewed today: ?No prior cardiac studies ? ?EKG:  EKG is personally reviewed.   ?08/27/21: NSR at 62 bpm ? ?Recent Labs: ?08/08/2021: ALT 30; BUN 16; Creatinine, Ser 1.07; Hemoglobin 15.5; Platelets 213; Potassium 4.9; Sodium 141; TSH 2.640  ?Recent Lipid Panel ?   ?Component Value Date/Time  ? CHOL 203 (H) 08/08/2021 0935  ? TRIG 183 (H) 08/08/2021 0935  ? HDL 42  08/08/2021 0935  ? CHOLHDL 4.8 08/08/2021 0935  ? LDLCALC 128 (H) 08/08/2021 0935  ? ? ?Physical Exam:   ? ?VS:  BP (!) 134/94 (BP Location: Left Arm, Patient Position: Sitting, Cuff Size: Large)   Pulse 62   Ht 6\' 1"  (1.854 m)   Wt 221 lb 11.2 oz (100.6 kg)   BMI 29.25 kg/m?    ?Orthostatic VS for the past 24 hrs (Last 3 readings): ? BP- Lying Pulse- Lying BP- Sitting Pulse- Sitting BP- Standing at 0 minutes Pulse- Standing at 0 minutes BP- Standing at 3 minutes Pulse- Standing at 3 minutes  ?08/27/21 1551 128/85 62 134/87 71 133/88 73 (!) 137/92 73  ?  ? ?Wt Readings from Last 3 Encounters:  ?08/27/21 221 lb 11.2 oz (100.6 kg)  ?08/08/21 217 lb 6.4 oz (98.6 kg)  ?09/24/17 220 lb (99.8 kg)  ?  ?GEN: Well nourished, well developed in no acute distress ?HEENT: Normal, moist mucous membranes ?NECK: No JVD ?CARDIAC: regular rhythm, normal S1 and S2, no rubs or gallops. No murmur. ?VASCULAR: Radial and DP pulses 2+ bilaterally. No carotid bruits ?RESPIRATORY:  Clear to auscultation without rales, wheezing or rhonchi  ?ABDOMEN: Soft, non-tender, non-distended ?MUSCULOSKELETAL:  Ambulates independently ?SKIN: Warm and dry, no edema ?NEUROLOGIC:  Alert and oriented x 3. No focal neuro deficits noted. ?PSYCHIATRIC:  Normal affect   ? ?ASSESSMENT:   ? ?1. Syncope and collapse   ?2. Cardiac risk counseling   ?3. Counseling on health promotion and disease prevention   ?4. History of chest pain   ? ?PLAN:   ? ?Syncope: ?-had prodrome, had contributing factors (travel, lack of sleep, feeling sick, energy drink), had rapid recovery ?-no Mckinney flag signs ?-we discussed the vagal response at length. Also discussed how this can play into pain (ie bradycardia he had with prior chest pain) ?-I believe a monitor would be low yield for him ?-he is low risk, normal exam. Discussed echo, but after shared decision making will not pursue at this time. ? ?Prior chest pain: given pattern, normal Tn, low suspicion that it is cardiac in  etiology. Discussed MSK pain today ? ?Cardiac risk counseling and prevention recommendations: ?-recommend heart healthy/Mediterranean diet, with whole grains, fruits, vegetable, fish, lean meats, nuts, and olive oil. Limit salt. ?-recommend moderate walking, 3-5 times/week for 30-50 minutes each session. Aim for at least 150 minutes.week. Goal should be pace of 3 miles/hours, or walking 1.5 miles in 30 minutes ?-recommend avoidance of tobacco products. Avoid excess alcohol. ?-ASCVD risk score: ?The 10-year ASCVD risk score (Arnett DK, et al., 2019) is: 2.8% ?  Values used to calculate the score: ?    Age: 56 years ?    Sex: Male ?    Is Non-Hispanic African American: No ?    Diabetic: No ?    Tobacco smoker: No ?    Systolic Blood Pressure: 134 mmHg ?    Is BP treated: No ?    HDL Cholesterol: 42 mg/dL ?    Total Cholesterol: 203 mg/dL   ? ?Plan  for follow up: as needed ? ?Benjamin Red, MD, PhD, [facility] ?[place]  [facility]   ? ?Medication Adjustments/Labs and Tests Ordered: ?Current medicines are reviewed at length with the patient today.  Concerns regarding medicines are outlined above.  ?Orders Placed This Encounter  ?Procedures  ? EKG 12-Lead  ? ?No orders of the defined types were placed in this encounter. ? ? ?Patient Instructions  ?Medication Instructions:  ?Your Physician recommend you continue on your current medication as directed.   ? ?*If you need a refill on your cardiac medications before your next appointment, please call your pharmacy* ? ? ?Lab Work: ?None ordered today ? ? ?Testing/Procedures: ?None ordered today ? ? ?Follow-Up: ?At [facility], you and your health needs are our priority.  As part of our continuing mission to provide you with exceptional heart care, we have created designated Provider Care Teams.  These Care Teams include your primary Cardiologist (physician) and Advanced Practice Providers (APPs -  Physician Assistants and Nurse Practitioners) who all work  together to provide you with the care you need, when you need it. ? ?We recommend signing up for the patient portal called "MyChart".  Sign up information is provided on this After Visit Summary.  MyChart is used to connect with patie

## 2021-08-27 NOTE — Patient Instructions (Signed)
Medication Instructions:  ?Your Physician recommend you continue on your current medication as directed.   ? ?*If you need a refill on your cardiac medications before your next appointment, please call your pharmacy* ? ? ?Lab Work: ?None ordered today ? ? ?Testing/Procedures: ?None ordered today ? ? ?Follow-Up: ?At [facility], you and your health needs are our priority.  As part of our continuing mission to provide you with exceptional heart care, we have created designated Provider Care Teams.  These Care Teams include your primary Cardiologist (physician) and Advanced Practice Providers (APPs -  Physician Assistants and Nurse Practitioners) who all work together to provide you with the care you need, when you need it. ? ?We recommend signing up for the patient portal called "MyChart".  Sign up information is provided on this After Visit Summary.  MyChart is used to connect with patients for Virtual Visits (Telemedicine).  Patients are able to view lab/test results, encounter notes, upcoming appointments, etc.  Non-urgent messages can be sent to your provider as well.   ?To learn more about what you can do with MyChart, go to [email or web address].   ? ?Your next appointment:   ?As needed ? ?The format for your next appointment:   ?In Person ? ?Provider:   ?Bridgette Christopher, MD{ ? ?Important Information About Sugar ? ? ? ? ? ? ?

## 2021-09-05 ENCOUNTER — Other Ambulatory Visit (HOSPITAL_BASED_OUTPATIENT_CLINIC_OR_DEPARTMENT_OTHER): Payer: Self-pay | Admitting: Nurse Practitioner

## 2021-09-05 DIAGNOSIS — M940 Chondrocostal junction syndrome [Tietze]: Secondary | ICD-10-CM

## 2021-10-07 ENCOUNTER — Other Ambulatory Visit (HOSPITAL_BASED_OUTPATIENT_CLINIC_OR_DEPARTMENT_OTHER): Payer: Self-pay | Admitting: Nurse Practitioner

## 2021-10-07 DIAGNOSIS — M940 Chondrocostal junction syndrome [Tietze]: Secondary | ICD-10-CM

## 2022-08-04 ENCOUNTER — Encounter: Payer: Self-pay | Admitting: Physician Assistant

## 2022-08-04 ENCOUNTER — Ambulatory Visit: Payer: No Typology Code available for payment source | Admitting: Physician Assistant

## 2022-08-04 VITALS — BP 120/80 | HR 74 | Temp 97.1°F | Ht 73.0 in | Wt 213.0 lb

## 2022-08-04 DIAGNOSIS — Z3009 Encounter for other general counseling and advice on contraception: Secondary | ICD-10-CM

## 2022-08-04 DIAGNOSIS — Z23 Encounter for immunization: Secondary | ICD-10-CM | POA: Diagnosis not present

## 2022-08-04 DIAGNOSIS — Z1211 Encounter for screening for malignant neoplasm of colon: Secondary | ICD-10-CM

## 2022-08-04 DIAGNOSIS — E782 Mixed hyperlipidemia: Secondary | ICD-10-CM

## 2022-08-04 DIAGNOSIS — R944 Abnormal results of kidney function studies: Secondary | ICD-10-CM | POA: Diagnosis not present

## 2022-08-04 DIAGNOSIS — J45990 Exercise induced bronchospasm: Secondary | ICD-10-CM | POA: Diagnosis not present

## 2022-08-04 DIAGNOSIS — Z0000 Encounter for general adult medical examination without abnormal findings: Secondary | ICD-10-CM

## 2022-08-04 MED ORDER — ALBUTEROL SULFATE HFA 108 (90 BASE) MCG/ACT IN AERS: 1.0000 | INHALATION_SPRAY | Freq: Four times a day (QID) | RESPIRATORY_TRACT | 6 refills | Status: DC | PRN

## 2022-08-04 NOTE — Patient Instructions (Addendum)
Welcome to [facility]! It was a pleasure meeting you today.  As discussed, Please schedule a 12 month follow up visit today.  Labs today. Tdap updated today.   Referral to [facility]: Phone: [phone] - please call to schedule.  Please complete Cologuard  Keep up good work with self-care! Limit alcohol use.   PLEASE NOTE:  If you had any LAB tests please let us know if you have not heard back within a few days. You may see your results on MyChart before we have a chance to review them but we will give you a call once they are reviewed by [place]. If we ordered any REFERRALS today, please let us know if you have not heard from their office within the next two weeks. Let us know through MyChart if you are needing REFILLS, or have your pharmacy send [place] the request. You can also use MyChart to communicate with me or any office staff.  Please try these tips to maintain a healthy lifestyle:  Eat most of your calories during the day when you are active. Eliminate processed foods including packaged sweets (pies, cakes, cookies), reduce intake of potatoes, white bread, white pasta, and white rice. Look for whole grain options, oat flour or almond flour.  Each meal should contain half fruits/vegetables, one quarter protein, and one quarter carbs (no bigger than a computer mouse).  Cut down on sweet beverages. This includes juice, soda, and sweet tea. Also watch fruit intake, though this is a healthier sweet option, it still contains natural sugar! Limit to 3 servings daily.  Drink at least 1 glass of water with each meal and aim for at least 8 glasses (64 ounces) per day.  Exercise at least 150 minutes every week to the best of your ability.    Take Care,  Irem Stoneham, PA-C

## 2022-08-04 NOTE — Progress Notes (Signed)
Subjective:    Patient ID: Benjamin Mckinney, male    DOB: 07/22/1975, 47 y.o.   MRN: [id number]  Chief Complaint  Patient presents with   New Patient (Initial Visit)    Pt in the office to establish care with PCP; requesting annual CPE with fasting labs; pt states last time kidney numbers were out of range and no follow up since then.     HPI Patient is in today for new pt establishment and annual CPE.  Acute concerns: Needing referral for vasectomy   Health maintenance: Lifestyle/ exercise: Hiking, marathon runner Nutrition: Doing well, good balance  Mental health: No concerns, doing well  Caffeine: black coffee daily  Sleep: No issues  Substance use: None  Sexual activity: Married Immunizations: Tdap updated  Colonoscopy: Cologuard ordered  Skin: No concerns, no hx cancer in family or self    Past Medical History:  Diagnosis Date   Asthma    exercise induced   Groin pain, right     Past Surgical History:  Procedure Laterality Date   HERNIA REPAIR     47 years old- umbilical    Family History  Problem Relation Age of Onset   Hypertension Mother    Hyperlipidemia Father    Liver disease Father     Social History   Tobacco Use   Smoking status: Never    Passive exposure: Never   Smokeless tobacco: Never  Vaping Use   Vaping Use: Never used  Substance Use Topics   Alcohol use: Yes    Comment: Socially 2-3 drinks per day   Drug use: Never     No Known Allergies  Review of Systems NEGATIVE UNLESS OTHERWISE INDICATED IN HPI      Objective:     BP 120/80 (BP Location: Left Arm)   Pulse 74   Temp (!) 97.1 F (36.2 C) (Temporal)   Ht  (1.854 m)   Wt 213 lb (96.6 kg)   SpO2 100%   BMI 28.10 kg/m   Wt Readings from Last 3 Encounters:  08/04/22 213 lb (96.6 kg)  08/27/21 221 lb 11.2 oz (100.6 kg)  08/08/21 217 lb 6.4 oz (98.6 kg)    BP Readings from Last 3 Encounters:  08/04/22 120/80  08/27/21 (!) 134/94  08/08/21 117/82      Physical Exam Vitals and nursing note reviewed.  Constitutional:      General: He is not in acute distress.    Appearance: Normal appearance. He is not toxic-appearing.  HENT:     Head: Normocephalic and atraumatic.     Right Ear: Tympanic membrane, ear canal and external ear normal.     Left Ear: Tympanic membrane, ear canal and external ear normal.     Nose: Nose normal.     Mouth/Throat:     Mouth: Mucous membranes are moist.     Pharynx: Oropharynx is clear.  Eyes:     Extraocular Movements: Extraocular movements intact.     Conjunctiva/sclera: Conjunctivae normal.     Pupils: Pupils are equal, round, and reactive to light.     Comments: Wears glasses  Cardiovascular:     Rate and Rhythm: Normal rate and regular rhythm.     Pulses: Normal pulses.     Heart sounds: Normal heart sounds.  Pulmonary:     Effort: Pulmonary effort is normal.     Breath sounds: Normal breath sounds.  Abdominal:     Palpations: Abdomen is soft.  Musculoskeletal:  General: Normal range of motion.     Cervical back: Normal range of motion and neck supple.  Skin:    General: Skin is warm and dry.  Neurological:     General: No focal deficit present.     Mental Status: He is alert and oriented to person, place, and time.  Psychiatric:        Mood and Affect: Mood normal.        Behavior: Behavior normal.        Assessment & Plan:  Encounter for annual physical exam -     Lipid panel -     TSH -     Comprehensive metabolic panel -     CBC with Differential/Platelet -     Microalbumin / creatinine urine ratio  Exercise-induced asthma -     Albuterol Sulfate HFA; Inhale 1-2 puffs into the lungs every 6 (six) hours as needed for wheezing or shortness of breath.  Dispense: 18 g; Refill: 6  Screening for colon cancer -     Cologuard  Need for prophylactic vaccination with combined diphtheria-tetanus-pertussis (DTP) vaccine -     Tdap vaccine greater than or equal to 7yo  IM  Decreased calculated GFR -     Comprehensive metabolic panel -     Microalbumin / creatinine urine ratio  Mixed hyperlipidemia -     Lipid panel  Encounter for vasectomy counseling -     Ambulatory referral to Urology   Age-appropriate screening and counseling performed today. Will check labs and call with results. Preventive measures discussed and printed in AVS for patient.   Patient Counseling:   Nutrition: Stressed importance of moderation in sodium/caffeine intake, saturated fat and cholesterol, caloric balance, sufficient intake of fresh fruits, vegetables, and fiber.    Stressed the importance of regular exercise.     Substance Abuse: Discussed cessation/primary prevention of tobacco, alcohol, or other drug use; driving or other dangerous activities under the influence; availability of treatment for abuse.     Injury prevention: Discussed safety belts, safety helmets, smoke detector, smoking near bedding or upholstery.     Sexuality: Discussed sexually transmitted diseases, partner selection, use of condoms, avoidance of unintended pregnancy  and contraceptive alternatives.     Dental health: Discussed importance of regular tooth brushing, flossing, and dental visits.    Health maintenance and immunizations reviewed. Please refer to Health maintenance section.         Return in about 1 year (around 08/04/2023) for physical.    Drexler Maland M Anelia Carriveau, PA-C

## 2022-08-07 ENCOUNTER — Other Ambulatory Visit: Payer: No Typology Code available for payment source

## 2022-08-07 DIAGNOSIS — R944 Abnormal results of kidney function studies: Secondary | ICD-10-CM

## 2022-08-07 NOTE — Addendum Note (Signed)
Addended by: Laddie Aquas A on: 08/07/2022 09:03 AM   Modules accepted: Orders

## 2022-08-19 ENCOUNTER — Telehealth: Payer: Self-pay | Admitting: Physician Assistant

## 2022-08-19 NOTE — Telephone Encounter (Signed)
Returned pt call and left detailed vm no labs have been collected just orders placed for patient to come and complete. Advised pt to contact office to schedule lab only appt at his convenience with office cb number

## 2022-08-19 NOTE — Telephone Encounter (Signed)
Pt sent msg to [facility] asking about lab results from 08/07/22. I do not see anything was done yet. Please advise.

## 2022-08-27 LAB — COLOGUARD: COLOGUARD: NEGATIVE

## 2023-04-17 ENCOUNTER — Ambulatory Visit: Payer: No Typology Code available for payment source | Admitting: Family

## 2023-04-17 ENCOUNTER — Ambulatory Visit: Payer: Self-pay | Admitting: Physician Assistant

## 2023-04-17 ENCOUNTER — Encounter: Payer: Self-pay | Admitting: Physician Assistant

## 2023-04-17 ENCOUNTER — Other Ambulatory Visit: Payer: Self-pay

## 2023-04-17 VITALS — BP 132/85 | HR 66 | Temp 97.0°F | Ht 73.0 in | Wt 224.6 lb

## 2023-04-17 DIAGNOSIS — R55 Syncope and collapse: Secondary | ICD-10-CM | POA: Diagnosis not present

## 2023-04-17 DIAGNOSIS — R110 Nausea: Secondary | ICD-10-CM

## 2023-04-17 DIAGNOSIS — J45990 Exercise induced bronchospasm: Secondary | ICD-10-CM

## 2023-04-17 MED ORDER — ONDANSETRON 4 MG PO TBDP: 4.0000 mg | ORAL_TABLET | Freq: Three times a day (TID) | ORAL | 0 refills | Status: AC | PRN

## 2023-04-17 MED ORDER — ALBUTEROL SULFATE HFA 108 (90 BASE) MCG/ACT IN AERS: 1.0000 | INHALATION_SPRAY | Freq: Four times a day (QID) | RESPIRATORY_TRACT | 1 refills | Status: DC | PRN

## 2023-04-17 NOTE — Telephone Encounter (Signed)
 FYI, pt scheduled today with Stephanie Hudnell.

## 2023-04-17 NOTE — Progress Notes (Signed)
 Patient ID: Benjamin Mckinney, male    DOB: [birth year], 48 y.o.   MRN: [id number]  Chief Complaint  Patient presents with   Post-op Problem    Pt states that he passed out this morning  for 1 min after waking up, SOB, clammy, hot and cold flashes, tunnel vision and loss of vision. Pt had vasectomy yesterday       Discussed the use of AI scribe software for clinical note transcription with the patient, who gave verbal consent to proceed.  History of Present Illness   The patient, with a history of vasovagal syncope, presents with his wife after experiencing a syncopal episode this morning. The patient woke up feeling well, but after getting up and moving around, he began to feel short of breath and clammy, similar to his previous experiences with respiratory infections. The patient then lost consciousness for a few seconds to a minute. The patient also experienced hot and cold flashes that lasted about twenty minutes afterward. The patient had a surgical procedure the previous day and had taken a Valium just prior to his procedure and then took tramadol for post-operative pain, 1 pill twice yesterday. He took his last dose of tramadol at bedtime, and has not taken any this morning prior to his syncopal episode.  He has not taken any Tramadol today or other OTC pain med, has applied ice, and reports pain 3/10 on pain scale. He has a history of similar syncopal episodes, with the first one occurring in 2018, which was associated with extreme chest and left shoulder pain. His pulse at that time was 35. The second episode occurred in 2022, after consuming an energy drink before a flight. The patient passed out on the plane, but by the time he returned to the gate, all his vitals were normal. The patient's wife reports pt feeling tired and sleeping in this morning, which is unusual for him. He has been eating normally and staying hydrated. He denies any nausea associated with the tramadol.     Assessment &  Plan:     Post-operative Syncope - Patient experienced a syncopal episode with associated clamminess and transient loss of vision after awakening this am. No chest pain or shortness of breath reported. Believe r/t vasovagal response to surgery/pain and/or medication induced. History of similar episodes in the past d/t different reasons. Current vitals stable, HR wnl. -Advised patient to hydrate well today and avoid skipping meals -Recommend slow transitions from sitting to standing. -Recommend trying Ibuprofen  600-800mg  tid or Naproxen 2 tabs bid for pain today instead of Tramadol, and continue to apply ice as directed by urologist.  -Consider use of ondansetron  4mg  ODT, for nausea or if feeling clammy or weak. -F/U prn for persistent sx.     Subjective:    Outpatient Medications Prior to Visit  Medication Sig Dispense Refill   albuterol  (VENTOLIN  HFA) 108 (90 Base) MCG/ACT inhaler Inhale 1-2 puffs into the lungs every 6 (six) hours as needed for wheezing or shortness of breath. 18 g 6   diazepam (VALIUM) 10 MG tablet Take 20 mg by mouth once.     traMADol (ULTRAM) 50 MG tablet Take 50-100 mg by mouth every 6 (six) hours as needed.     No facility-administered medications prior to visit.   Past Medical History:  Diagnosis Date   Asthma    exercise induced   Groin pain, right    Past Surgical History:  Procedure Laterality Date   HERNIA REPAIR  48 years old- umbilical   No Known Allergies    Objective:    Physical Exam Vitals and nursing note reviewed.  Constitutional:      General: He is not in acute distress.    Appearance: Normal appearance.  HENT:     Head: Normocephalic.  Cardiovascular:     Rate and Rhythm: Normal rate and regular rhythm.  Pulmonary:     Effort: Pulmonary effort is normal.     Breath sounds: Normal breath sounds.  Musculoskeletal:        General: Normal range of motion.     Cervical back: Normal range of motion.  Skin:    General: Skin is  warm and dry.  Neurological:     Mental Status: He is alert and oriented to person, place, and time.  Psychiatric:        Mood and Affect: Mood normal.   BP 132/85   Pulse 66   Temp (!) 97 F (36.1 C)   Ht 6' 1 (1.854 m)   Wt 224 lb 9.6 oz (101.9 kg)   SpO2 97%   BMI 29.63 kg/m  Wt Readings from Last 3 Encounters:  04/17/23 224 lb 9.6 oz (101.9 kg)  08/04/22 213 lb (96.6 kg)  08/27/21 221 lb 11.2 oz (100.6 kg)      Lucius Krabbe, NP

## 2023-04-17 NOTE — Telephone Encounter (Signed)
  Chief Complaint: Episode of syncope Symptoms: SOB, syncope Frequency: One time this AM Pertinent Negatives: Patient denies pain, swelling, redness, drainage at incision site, fever Disposition: [] ED /[] Urgent Care (no appt availability in office) / [x] Appointment(In office/virtual)/ []  [facility]/ [] Home Care/ [] Refused Recommended Disposition /[] [facility]/ []  Follow-up with PCP Additional Notes: Pt's spouse reports pt had vasectomy done 04/16/23, this morning he had one episode that began with feeling SOB/nauses and he passed out and came to on his own per spouse who he then alerted. BP taken with home cuff WNL. Denies fever but does report some chills/clammy skin, no vomiting, confusion or dizziness noted. Pt has returned to baseline and feels fine. Pt reports taking 50 mg Tramadol every 6 hours PRN as prescribed by surgeon. Pt's spouse requested virtual visit however d/t symptoms, concerns and potential need for additional labs pt scheduled in office. This RN educated spouse on new or worsening symptoms, when to call back/seek emergent care. Pt's spouse verbalized understanding and agrees to plan.   Reason for Disposition  [1] Caller has URGENT question AND [2] triager unable to answer question  Answer Assessment - Initial Assessment Questions 1. SYMPTOM: What's the main symptom you're concerned about? (e.g., pain, fever, vomiting)     SOB, passed out 2. ONSET: When did symptoms  start?     This AM 3. SURGERY: What surgery did you have?     Vasectomy 4. DATE of SURGERY: When was the surgery?      04/16/2023 5. ANESTHESIA:  What type of anesthesia did you have? (e.g., general, spinal, epidural, local)     Local, 2 Valium prior 6. PAIN: Is there any pain? If Yes, ask: How bad is it?  (Scale 1-10; or mild, moderate, severe)     None 7. FEVER: Do you have a fever? If Yes, ask: What is your temperature, how was it measured, and when did it  start?     None, chills/clammy intermittently 8. VOMITING: Is there any vomiting? If Yes, ask: How many times?     None, nausea during the episode 9. BLEEDING: Is there any bleeding? If Yes, ask: How much? and Where?     Scant blood on gauze this AM 10. OTHER SYMPTOMS: Do you have any other symptoms? (e.g., drainage from wound, painful urination, constipation)       None  Protocols used: Post-Op Symptoms and Questions-A-AH

## 2023-05-25 ENCOUNTER — Other Ambulatory Visit: Payer: Self-pay | Admitting: Physician Assistant

## 2023-05-25 DIAGNOSIS — J45990 Exercise induced bronchospasm: Secondary | ICD-10-CM

## 2023-06-23 ENCOUNTER — Encounter: Admitting: Physician Assistant

## 2023-08-20 ENCOUNTER — Encounter: Payer: Self-pay | Admitting: Physician Assistant

## 2023-08-20 ENCOUNTER — Other Ambulatory Visit: Payer: Self-pay

## 2023-08-20 DIAGNOSIS — Z0000 Encounter for general adult medical examination without abnormal findings: Secondary | ICD-10-CM

## 2023-08-26 ENCOUNTER — Other Ambulatory Visit (INDEPENDENT_AMBULATORY_CARE_PROVIDER_SITE_OTHER)

## 2023-08-26 ENCOUNTER — Ambulatory Visit: Payer: Self-pay | Admitting: Physician Assistant

## 2023-08-26 DIAGNOSIS — Z0000 Encounter for general adult medical examination without abnormal findings: Secondary | ICD-10-CM | POA: Diagnosis not present

## 2023-08-26 DIAGNOSIS — Z131 Encounter for screening for diabetes mellitus: Secondary | ICD-10-CM | POA: Diagnosis not present

## 2023-08-26 LAB — COMPREHENSIVE METABOLIC PANEL WITH GFR
ALT: 40 U/L (ref 0–53)
AST: 31 U/L (ref 0–37)
Albumin: 4.8 g/dL (ref 3.5–5.2)
Alkaline Phosphatase: 65 U/L (ref 39–117)
BUN: 14 mg/dL (ref 6–23)
CO2: 28 meq/L (ref 19–32)
Calcium: 9.8 mg/dL (ref 8.4–10.5)
Chloride: 103 meq/L (ref 96–112)
Creatinine, Ser: 1.01 mg/dL (ref 0.40–1.50)
GFR: 88.85 mL/min (ref >60.00)
Glucose, Bld: 101 mg/dL — ABNORMAL HIGH (ref 70–99)
Potassium: 4.7 meq/L (ref 3.5–5.1)
Sodium: 140 meq/L (ref 135–145)
Total Bilirubin: 0.6 mg/dL (ref 0.2–1.2)
Total Protein: 7.8 g/dL (ref 6.0–8.3)

## 2023-08-26 LAB — LIPID PANEL
Cholesterol: 261 mg/dL — ABNORMAL HIGH (ref 0–200)
HDL: 55.9 mg/dL (ref >39.00)
LDL Cholesterol: 180 mg/dL — ABNORMAL HIGH (ref 0–99)
NonHDL: 205.14
Total CHOL/HDL Ratio: 5
Triglycerides: 128 mg/dL (ref 0.0–149.0)
VLDL: 25.6 mg/dL (ref 0.0–40.0)

## 2023-08-26 LAB — CBC WITH DIFFERENTIAL/PLATELET
Basophils Absolute: 0 10*3/uL (ref 0.0–0.1)
Basophils Relative: 0.8 % (ref 0.0–3.0)
Eosinophils Absolute: 0 10*3/uL (ref 0.0–0.7)
Eosinophils Relative: 1.1 % (ref 0.0–5.0)
HCT: 46 % (ref 39.0–52.0)
Hemoglobin: 15.3 g/dL (ref 13.0–17.0)
Lymphocytes Relative: 40.1 % (ref 12.0–46.0)
Lymphs Abs: 1.5 10*3/uL (ref 0.7–4.0)
MCHC: 33.3 g/dL (ref 30.0–36.0)
MCV: 94.4 fl (ref 78.0–100.0)
Monocytes Absolute: 0.4 10*3/uL (ref 0.1–1.0)
Monocytes Relative: 9.9 % (ref 3.0–12.0)
Neutro Abs: 1.8 10*3/uL (ref 1.4–7.7)
Neutrophils Relative %: 48.1 % (ref 43.0–77.0)
Platelets: 189 10*3/uL (ref 150.0–400.0)
RBC: 4.88 Mil/uL (ref 4.22–5.81)
RDW: 12.6 % (ref 11.5–15.5)
WBC: 3.7 10*3/uL — ABNORMAL LOW (ref 4.0–10.5)

## 2023-08-26 LAB — HEMOGLOBIN A1C: Hgb A1c MFr Bld: 5.1 % (ref 4.6–6.5)

## 2023-08-26 LAB — TSH: TSH: 2.62 u[IU]/mL (ref 0.35–5.50)

## 2023-09-03 ENCOUNTER — Encounter: Payer: No Typology Code available for payment source | Admitting: Physician Assistant

## 2023-09-03 ENCOUNTER — Ambulatory Visit (INDEPENDENT_AMBULATORY_CARE_PROVIDER_SITE_OTHER): Admitting: Physician Assistant

## 2023-09-03 VITALS — BP 128/92 | HR 81 | Temp 98.1°F | Ht 72.84 in | Wt 219.0 lb

## 2023-09-03 DIAGNOSIS — Z0000 Encounter for general adult medical examination without abnormal findings: Secondary | ICD-10-CM

## 2023-09-03 DIAGNOSIS — J45990 Exercise induced bronchospasm: Secondary | ICD-10-CM

## 2023-09-03 DIAGNOSIS — E782 Mixed hyperlipidemia: Secondary | ICD-10-CM

## 2023-09-03 NOTE — Progress Notes (Signed)
 Patient ID: Benjamin Mckinney, male    DOB: June 24, 1975, 48 y.o.   MRN: [id number]   Assessment & Plan:  Annual physical exam  Mixed hyperlipidemia -     Lipid panel; Future -     CT CARDIAC SCORING (SELF PAY ONLY); Future  Exercise-induced asthma      Assessment and Plan Assessment & Plan Exercise-induced asthma Exercise-induced asthma is well-managed with no recent exacerbations. He continues regular running, including a recent 5-mile run. No immediate inhaler refill is needed, but he anticipates requiring one in the future. - Instruct him to request an inhaler refill when needed - Advise him to monitor symptoms during exercise  Hyperlipidemia Hyperlipidemia with LDL cholesterol levels requiring monitoring. Current 10-year risk score is 3%, below the threshold of concern. He is aware of the importance of lifestyle modifications, including exercise and dietary changes, to manage cholesterol levels. His wife has encouraged a CT calcium score for further assessment. Discussed the option of waiting six months to reassess cholesterol levels before proceeding with the CT calcium score, but he prefers to proceed with the test now. - Order CT calcium score - Schedule follow-up cholesterol check in 6 months - Advise him to continue exercise and healthy diet - Discuss potential need for cholesterol medication if family history of early heart disease is present   Age-appropriate screening and counseling performed today. Will check labs and call with results. Preventive measures discussed and printed in AVS for patient.   Patient Counseling: [x]   Nutrition: Stressed importance of moderation in sodium/caffeine intake, saturated fat and cholesterol, caloric balance, sufficient intake of fresh fruits, vegetables, and fiber.  [x]   Stressed the importance of regular exercise.   [x]   Substance Abuse: Discussed cessation/primary prevention of tobacco, alcohol, or other drug use; driving or other  dangerous activities under the influence; availability of treatment for abuse.   []   Injury prevention: Discussed safety belts, safety helmets, smoke detector, smoking near bedding or upholstery.   []   Sexuality: Discussed sexually transmitted diseases, partner selection, use of condoms, avoidance of unintended pregnancy  and contraceptive alternatives.   [x]   Dental health: Discussed importance of regular tooth brushing, flossing, and dental visits.  [x]   Health maintenance and immunizations reviewed. Please refer to Health maintenance section.        No follow-ups on file.    Subjective:    Chief Complaint  Patient presents with   Annual Exam    Pt seen in office today for CPE; no health changes in the past year     HPI Discussed the use of AI scribe software for clinical note transcription with the patient, who gave verbal consent to proceed.  History of Present Illness Benjamin Mckinney is a 48 year old male who presents for an annual physical exam.  He has not experienced any significant health changes over the past year and continues to engage in regular physical activities such as hiking and running. He is preparing for a race in August in [place]  with his brother. No chest pain, shortness of breath outside of exercise-induced asthma, or other unusual symptoms during exercise.  He mentions increased stress due to the loss of his father-in-law in November and his stepmother recently. He consumes alcohol regularly, primarily beer, estimating four to six drinks on occasion. He has not picked up smoking or increased his alcohol consumption over the past year.  He has a history of elevated cholesterol levels, with a recent fasting test showing LDL cholesterol as  a concern. He eats well and has considered cutting back on caffeine and alcohol.  He has exercise-induced asthma and experiences shortness of breath during physical activity. He is currently managing this condition with  an inhaler, which he refilled in February. He plans to request a refill when needed.  No new skin changes or moles, although he has a longstanding mole near his left axilla that has not changed. He recently sustained a minor leg injury from walking into a wooden pylon while camping.  He completed a Cologuard test for colon cancer screening with no changes or bright red blood in his stool. No family history of prostate cancer and is not currently undergoing PSA screening.     Past Medical History:  Diagnosis Date   Allergy    Asthma    exercise induced   Groin pain, right     Past Surgical History:  Procedure Laterality Date   HERNIA REPAIR     48 years old- umbilical    Family History  Problem Relation Age of Onset   Hypertension Mother    Arthritis Mother    Asthma Mother    Hyperlipidemia Father    Liver disease Father    ADD / ADHD Son    Asthma Son     Social History   Tobacco Use   Smoking status: Never    Passive exposure: Never   Smokeless tobacco: Never  Vaping Use   Vaping status: Never Used  Substance Use Topics   Alcohol use: Yes    Comment: Socially 2-3 drinks per day   Drug use: Never     No Known Allergies  Review of Systems NEGATIVE UNLESS OTHERWISE INDICATED IN HPI      Objective:     BP (!) 128/92   Pulse 81   Temp 98.1 F (36.7 C) (Temporal)   Ht 6' 0.84" (1.85 m)   Wt 219 lb (99.3 kg)   SpO2 98%   BMI 29.02 kg/m   Wt Readings from Last 3 Encounters:  09/03/23 219 lb (99.3 kg)  04/17/23 224 lb 9.6 oz (101.9 kg)  08/04/22 213 lb (96.6 kg)    BP Readings from Last 3 Encounters:  09/03/23 (!) 128/92  04/17/23 132/85  08/04/22 120/80     Physical Exam Vitals and nursing note reviewed.  Constitutional:      General: He is not in acute distress.    Appearance: Normal appearance. He is not toxic-appearing.  HENT:     Head: Normocephalic and atraumatic.     Right Ear: Tympanic membrane, ear canal and external ear normal.      Left Ear: Tympanic membrane, ear canal and external ear normal.     Nose: Nose normal.     Mouth/Throat:     Mouth: Mucous membranes are moist.     Pharynx: Oropharynx is clear.  Eyes:     Extraocular Movements: Extraocular movements intact.     Conjunctiva/sclera: Conjunctivae normal.     Pupils: Pupils are equal, round, and reactive to light.     Comments: Wears glasses  Cardiovascular:     Rate and Rhythm: Normal rate and regular rhythm.     Pulses: Normal pulses.     Heart sounds: Normal heart sounds.  Pulmonary:     Effort: Pulmonary effort is normal.     Breath sounds: Normal breath sounds.  Abdominal:     Palpations: Abdomen is soft.  Musculoskeletal:        General: Normal range  of motion.     Cervical back: Normal range of motion and neck supple.  Skin:    General: Skin is warm and dry.  Neurological:     General: No focal deficit present.     Mental Status: He is alert and oriented to person, place, and time.  Psychiatric:        Mood and Affect: Mood normal.        Behavior: Behavior normal.             Brodin Gelpi M Rigby Leonhardt, PA-C

## 2023-09-03 NOTE — Patient Instructions (Addendum)
 Schedule for 6 month lab only fasting lipid panel  Schedule for 1 year return CPE   Keep working on alcohol reduction Keep up good work with exercise and nutrition  You will be contacted to schedule the CT cardiac score imaging

## 2023-10-17 ENCOUNTER — Encounter: Payer: Self-pay | Admitting: Physician Assistant

## 2024-02-12 ENCOUNTER — Other Ambulatory Visit: Payer: Self-pay | Admitting: Physician Assistant

## 2024-02-12 DIAGNOSIS — J45990 Exercise induced bronchospasm: Secondary | ICD-10-CM

## 2024-04-08 ENCOUNTER — Other Ambulatory Visit: Payer: Self-pay | Admitting: Physician Assistant

## 2024-04-08 DIAGNOSIS — J45990 Exercise induced bronchospasm: Secondary | ICD-10-CM
